# Patient Record
Sex: Female | Born: 1935 | Race: White | Hispanic: No | Smoking: Never smoker
Health system: Southern US, Community
[De-identification: ages and names within clinical notes are randomized; demographics above are authoritative.]

---

## 2013-06-22 DIAGNOSIS — J3489 Other specified disorders of nose and nasal sinuses: Secondary | ICD-10-CM | POA: Diagnosis not present

## 2013-06-22 DIAGNOSIS — H9190 Unspecified hearing loss, unspecified ear: Secondary | ICD-10-CM | POA: Diagnosis not present

## 2013-06-22 DIAGNOSIS — E785 Hyperlipidemia, unspecified: Secondary | ICD-10-CM | POA: Diagnosis not present

## 2013-06-22 DIAGNOSIS — I10 Essential (primary) hypertension: Secondary | ICD-10-CM | POA: Diagnosis not present

## 2013-06-22 DIAGNOSIS — R1310 Dysphagia, unspecified: Secondary | ICD-10-CM | POA: Diagnosis not present

## 2013-06-22 DIAGNOSIS — H6120 Impacted cerumen, unspecified ear: Secondary | ICD-10-CM | POA: Diagnosis not present

## 2013-06-22 DIAGNOSIS — J309 Allergic rhinitis, unspecified: Secondary | ICD-10-CM | POA: Diagnosis not present

## 2013-07-24 DIAGNOSIS — H2510 Age-related nuclear cataract, unspecified eye: Secondary | ICD-10-CM | POA: Diagnosis not present

## 2013-07-24 DIAGNOSIS — Z0100 Encounter for examination of eyes and vision without abnormal findings: Secondary | ICD-10-CM | POA: Diagnosis not present

## 2013-08-17 DIAGNOSIS — R51 Headache: Secondary | ICD-10-CM | POA: Diagnosis not present

## 2013-08-21 DIAGNOSIS — J329 Chronic sinusitis, unspecified: Secondary | ICD-10-CM | POA: Diagnosis not present

## 2013-09-11 DIAGNOSIS — I10 Essential (primary) hypertension: Secondary | ICD-10-CM | POA: Diagnosis not present

## 2013-09-11 DIAGNOSIS — M8448XA Pathological fracture, other site, initial encounter for fracture: Secondary | ICD-10-CM | POA: Diagnosis not present

## 2013-09-11 DIAGNOSIS — F411 Generalized anxiety disorder: Secondary | ICD-10-CM | POA: Diagnosis not present

## 2013-09-11 DIAGNOSIS — M810 Age-related osteoporosis without current pathological fracture: Secondary | ICD-10-CM | POA: Diagnosis not present

## 2013-09-11 DIAGNOSIS — B029 Zoster without complications: Secondary | ICD-10-CM | POA: Diagnosis not present

## 2013-09-11 DIAGNOSIS — J309 Allergic rhinitis, unspecified: Secondary | ICD-10-CM | POA: Diagnosis not present

## 2013-09-11 DIAGNOSIS — B070 Plantar wart: Secondary | ICD-10-CM | POA: Diagnosis not present

## 2013-09-11 DIAGNOSIS — E785 Hyperlipidemia, unspecified: Secondary | ICD-10-CM | POA: Diagnosis not present

## 2013-09-30 DIAGNOSIS — L821 Other seborrheic keratosis: Secondary | ICD-10-CM | POA: Diagnosis not present

## 2013-09-30 DIAGNOSIS — D485 Neoplasm of uncertain behavior of skin: Secondary | ICD-10-CM | POA: Diagnosis not present

## 2013-11-24 DIAGNOSIS — R350 Frequency of micturition: Secondary | ICD-10-CM | POA: Diagnosis not present

## 2013-11-24 DIAGNOSIS — M7989 Other specified soft tissue disorders: Secondary | ICD-10-CM | POA: Diagnosis not present

## 2013-11-24 DIAGNOSIS — I10 Essential (primary) hypertension: Secondary | ICD-10-CM | POA: Diagnosis not present

## 2013-11-24 DIAGNOSIS — R82998 Other abnormal findings in urine: Secondary | ICD-10-CM | POA: Diagnosis not present

## 2013-12-04 DIAGNOSIS — R5383 Other fatigue: Secondary | ICD-10-CM | POA: Diagnosis not present

## 2013-12-04 DIAGNOSIS — I10 Essential (primary) hypertension: Secondary | ICD-10-CM | POA: Diagnosis not present

## 2013-12-04 DIAGNOSIS — R5381 Other malaise: Secondary | ICD-10-CM | POA: Diagnosis not present

## 2013-12-04 DIAGNOSIS — N390 Urinary tract infection, site not specified: Secondary | ICD-10-CM | POA: Diagnosis not present

## 2013-12-26 DIAGNOSIS — R300 Dysuria: Secondary | ICD-10-CM | POA: Diagnosis not present

## 2013-12-26 DIAGNOSIS — N760 Acute vaginitis: Secondary | ICD-10-CM | POA: Diagnosis not present

## 2014-01-22 DIAGNOSIS — M25561 Pain in right knee: Secondary | ICD-10-CM | POA: Diagnosis not present

## 2014-01-22 DIAGNOSIS — M25562 Pain in left knee: Secondary | ICD-10-CM | POA: Diagnosis not present

## 2014-01-22 DIAGNOSIS — M170 Bilateral primary osteoarthritis of knee: Secondary | ICD-10-CM | POA: Diagnosis not present

## 2014-02-05 ENCOUNTER — Ambulatory Visit (INDEPENDENT_AMBULATORY_CARE_PROVIDER_SITE_OTHER): Payer: Medicare Other | Admitting: Physician Assistant

## 2014-02-05 VITALS — BP 173/86 | HR 88 | Ht 59.0 in | Wt 146.0 lb

## 2014-02-05 DIAGNOSIS — M170 Bilateral primary osteoarthritis of knee: Secondary | ICD-10-CM | POA: Diagnosis not present

## 2014-02-05 DIAGNOSIS — Z23 Encounter for immunization: Secondary | ICD-10-CM | POA: Diagnosis not present

## 2014-02-05 DIAGNOSIS — I10 Essential (primary) hypertension: Secondary | ICD-10-CM

## 2014-02-05 DIAGNOSIS — H1045 Other chronic allergic conjunctivitis: Secondary | ICD-10-CM

## 2014-02-05 DIAGNOSIS — J301 Allergic rhinitis due to pollen: Secondary | ICD-10-CM

## 2014-02-05 DIAGNOSIS — R300 Dysuria: Secondary | ICD-10-CM | POA: Diagnosis not present

## 2014-02-05 DIAGNOSIS — J309 Allergic rhinitis, unspecified: Secondary | ICD-10-CM | POA: Insufficient documentation

## 2014-02-05 DIAGNOSIS — N3000 Acute cystitis without hematuria: Secondary | ICD-10-CM | POA: Diagnosis not present

## 2014-02-05 LAB — POCT URINALYSIS DIPSTICK
BILIRUBIN UA: NEGATIVE
Blood, UA: NEGATIVE
Glucose, UA: NEGATIVE
KETONES UA: NEGATIVE
Nitrite, UA: NEGATIVE
PH UA: 6
Protein, UA: NEGATIVE
Spec Grav, UA: 1.015
Urobilinogen, UA: 0.2

## 2014-02-05 LAB — WET PREP FOR TRICH, YEAST, CLUE
CLUE CELLS WET PREP: NONE SEEN
TRICH WET PREP: NONE SEEN
WBC WET PREP: NONE SEEN
Yeast Wet Prep HPF POC: NONE SEEN

## 2014-02-05 MED ORDER — AZELASTINE HCL 0.1 % NA SOLN: 2.0000 | Freq: Every day | NASAL | Status: DC

## 2014-02-05 MED ORDER — BENAZEPRIL HCL 40 MG PO TABS: 40.0000 mg | ORAL_TABLET | Freq: Every day | ORAL | Status: DC

## 2014-02-05 MED ORDER — OLOPATADINE HCL 0.2 % OP SOLN: OPHTHALMIC | Status: DC

## 2014-02-05 MED ORDER — NITROFURANTOIN MONOHYD MACRO 100 MG PO CAPS: 100.0000 mg | ORAL_CAPSULE | Freq: Two times a day (BID) | ORAL | Status: DC

## 2014-02-05 NOTE — Progress Notes (Signed)
Subjective:    Patient ID: Mallory Reeves, female    DOB: [birth year], 80 y.o.   MRN: [id number]  HPI Pt is a 80 yo female who presents to the clinic to establish care. Her Doctor stopped practicing and needs a health care provider.   .. Active Ambulatory Problems    Diagnosis Date Noted  . Essential hypertension, benign 02/05/2014  . Primary osteoarthritis of both knees 02/05/2014  . Allergic rhinitis 02/05/2014  . Chronic allergic conjunctivitis 02/05/2014   Resolved Ambulatory Problems    Diagnosis Date Noted  . No Resolved Ambulatory Problems   No Additional Past Medical History  .Marland Kitchen History   Social History  . Marital Status: Unknown    Spouse Name: N/A    Number of Children: N/A  . Years of Education: N/A   Occupational History  . Not on file.   Social History Main Topics  . Smoking status: Not on file  . Smokeless tobacco: Not on file  . Alcohol Use: Not on file  . Drug Use: Not on file  . Sexual Activity: Not on file   Other Topics Concern  . Not on file   Social History Narrative  . No narrative on file   Hypertension-patient does have diagnosed hypertension well controlled with medication. Blood pressure is elevated today. Patient denies any chest pain, palpitations, headaches or vision changes. Her caregiver is with her and checks her blood pressure during the week. She states that a lot of times when she goes to a new facility her blood pressure is elevated.  Osteoarthritis of both knees-patient is currently being managed by [facility]ist [facility] [place]. She recently received steroid injection in the knee.     Review of Systems     Objective:   Physical Exam        Assessment & Plan:  Hypertension-discuss with patient likely could be elevated due to first visit. Encouraged caregiver to keep a log of blood pressures and if running above 160/90 to give [place] a call or follow-up in office. Will not make any adjustments to medications today refills  given.  Osteoarthritis bilateral knees-managed by orth[facility]ist of [place].  Patient unsure of her pneumonia vaccines. She states she has not had one in the last 3-5 years. Will give Prevnar 13 today. Likely in records we will confirm she has had the pneumococcal 23 vaccine.  Dysuria- .. Results for orders placed in visit on 02/05/14  WET PREP FOR [place], YEAST, CLUE      Result Value Ref Range   Yeast Wet Prep HPF POC NONE SEEN  NONE SEEN   Trich, Wet Prep NONE SEEN  NONE SEEN   Clue Cells Wet Prep HPF POC NONE SEEN  NONE SEEN   WBC, Wet Prep HPF POC NONE SEEN  NONE SEEN  POCT URINALYSIS DIPSTICK      Result Value Ref Range   Color, UA yellow     Clarity, UA clear     Glucose, UA neg     Bilirubin, UA neg     Ketones, UA neg     Spec Grav, UA 1.015     Blood, UA neg     pH, UA 6.0     Protein, UA neg     Urobilinogen, UA 0.2     Nitrite, UA neg     Leukocytes, UA small (1+)     Will treat for infection due to hx and age. macrobid sent. If symptoms continued please follow up to  discussed other causing of burning urination. Pt does not wear diaper and denies urinary leakage however I find this very hard to believe.   Patient's fall risk is high however she walks with a walker and had no fall risk in last year.   Patient does have living well.  Clinic no records to confirm Tdap vaccination.  Patient declines any further colonoscopies or mammograms.

## 2014-02-12 ENCOUNTER — Encounter: Payer: Self-pay | Admitting: Physician Assistant

## 2014-02-12 DIAGNOSIS — R531 Weakness: Secondary | ICD-10-CM | POA: Insufficient documentation

## 2014-03-05 ENCOUNTER — Encounter: Payer: Self-pay | Admitting: Physician Assistant

## 2014-03-05 ENCOUNTER — Ambulatory Visit (INDEPENDENT_AMBULATORY_CARE_PROVIDER_SITE_OTHER): Payer: Medicare Other | Admitting: Physician Assistant

## 2014-03-05 VITALS — BP 158/76 | HR 81 | Ht 59.0 in | Wt 148.0 lb

## 2014-03-05 DIAGNOSIS — R1310 Dysphagia, unspecified: Secondary | ICD-10-CM

## 2014-03-05 DIAGNOSIS — G4700 Insomnia, unspecified: Secondary | ICD-10-CM | POA: Diagnosis not present

## 2014-03-05 DIAGNOSIS — R531 Weakness: Secondary | ICD-10-CM | POA: Diagnosis not present

## 2014-03-05 DIAGNOSIS — R600 Localized edema: Secondary | ICD-10-CM

## 2014-03-05 DIAGNOSIS — I10 Essential (primary) hypertension: Secondary | ICD-10-CM | POA: Diagnosis not present

## 2014-03-05 DIAGNOSIS — H1045 Other chronic allergic conjunctivitis: Secondary | ICD-10-CM

## 2014-03-05 NOTE — Progress Notes (Signed)
Subjective:    Patient ID: Mallory Reeves, female    DOB: August 09, 1916, 80 y.o.   MRN: [id number]  HPI Patient is a 80 year old female who presents to the clinic to follow-up after establishing care 1 month ago.  Chronic allergic rhinitis/conjunctivitis-patient questions using nasal sprays daily and if she should continue. Symptoms seem to be under control.  Hypertension-continues to take Lotensin daily. Denies any chest pain, palpitations, headaches or vision changes.  Insomnia-we do not have all mirabilis but she states she has some nerve pills at home that she takes as needed when she cannot sleep. She tends to replate in her mom the death of her son and her 2 husbands. She does not take this every night. She wonders if this is okay.  Patient does have some bilateral lower extremity swelling. This is not daily but often on. She questions if there is anything she should do. She denies any leg pain. She has not done anything to make better. She does admit to sitting all day in her chair. She does not elevate her extremities.  Patient also has some off and on problems swallowing. She notices that breads get stuck the most. She wonders if there is anything we can do for this. She continues to eat and maintain a healthy weight.   Review of Systems  All other systems reviewed and are negative.      Objective:   Physical Exam  Constitutional: She is oriented to person, place, and time. She appears well-developed and well-nourished.  HENT:  Head: Normocephalic and atraumatic.  Right Ear: External ear normal.  Left Ear: External ear normal.  Nose: Nose normal.  Mouth/Throat: Oropharynx is clear and moist. No oropharyngeal exudate.  Eyes: Conjunctivae are normal.  Neck: Normal range of motion. Neck supple.  Cardiovascular: Normal rate, regular rhythm and normal heart sounds.   Pulmonary/Chest: Effort normal and breath sounds normal. She has no wheezes.  Lymphadenopathy:    She has no  cervical adenopathy.  Neurological: She is alert and oriented to person, place, and time.  Skin: Skin is dry.  Trace bilateral edema around ankles. Pedal pulses 2+ and symmetric. No color changes to bilateral feet.  Psychiatric: She has a normal mood and affect. Her behavior is normal.          Assessment & Plan:  Chronic allergic conjunctivitis and rhinitis-discuss with patient that I think it would not be unreasonable for her to start using nasal and eye drops every other day his symptoms are controlled. She's having some ongoing nasal dryness she could also consider ayr, nasal saline for moisture. Make sure humidifier is in room to help with symptoms.  Hypertension-patient blood pressure has come down from last visit as well as caregiver comes in with verbal communication of her blood pressures running much lower when she checks them at home in the 130 and 140s. Continue Lotensin. Follow-up in 6 months  Insomnia-discuss with patient I did not have nerve pill on her med list. Please call with name and dosage of tablet so that we can add. She did not need refills today. I did discuss that sounds like this pill is a benzodiazepine. We do use caution in the elderly with benzos. Discuss fall risk. Discussed the patient to use only as needed and only take before bedtime.  Bilateral ankle edema-discuss with patient the sounds like chronic venous insufficiency. Her pedal pulses are great. She denies any pain. We will treat with elevation and possible compression stockings. Patient  is just not able to put on compression stockings but I suggested with her caregiver a titer knee sock could also work. Currently patient is not elevating her legs. Start with elevation when she is in the chair. Discussed mechanism of action of the swelling. If gets worse or there is pain follow-up.  Dysphagia- reassured patient of what dysphagia is. Discussed we could do some testing but likely there is some slight narrowing  of the esophagus or perhaps even her epiglottis is having some dysfunction. She seems to be gaining weight and eating foods. Discuss proper chewing and to take time with meals. If she would like to proceed with testing she can call back and we can schedule. I did discuss with her that sometimes the procedures and the treatment for these things can set you back  more than treating the ongoing current condition. Discussed could try PPI to see if helped. pt did not want to start new medication. Follow-up as needed.  Please follow-up in 6 months or sooner if needed.

## 2014-03-05 NOTE — Patient Instructions (Addendum)
Ayr for nasal dryness.   Venous Stasis or Chronic Venous Insufficiency Chronic venous insufficiency, also called venous stasis, is a condition that affects the veins in the legs. The condition prevents blood from being pumped through these veins effectively. Blood may no longer be pumped effectively from the legs back to the heart. This condition can range from mild to severe. With proper treatment, you should be able to continue with an active life. CAUSES  Chronic venous insufficiency occurs when the vein walls become stretched, weakened, or damaged or when valves within the vein are damaged. Some common causes of this include:  High blood pressure inside the veins (venous hypertension).  Increased blood pressure in the leg veins from long periods of sitting or standing.  A blood clot that blocks blood flow in a vein (deep vein thrombosis).  Inflammation of a superficial vein (phlebitis) that causes a blood clot to form. RISK FACTORS Various things can make you more likely to develop chronic venous insufficiency, including:  Family history of this condition.  Obesity.  Pregnancy.  Sedentary lifestyle.  Smoking.  Jobs requiring long periods of standing or sitting in one place.  Being a certain age. Women in their 62s and 39s and men in their 32s are more likely to develop this condition. SIGNS AND SYMPTOMS  Symptoms may include:   Varicose veins.  Skin breakdown or ulcers.  Reddened or discolored skin on the leg.  Brown, smooth, tight, and painful skin just above the ankle, usually on the inside surface (lipodermatosclerosis).  Swelling. DIAGNOSIS  To diagnose this condition, your health care provider will take a medical history and do a physical exam. The following tests may be ordered to confirm the diagnosis:  Duplex ultrasound--A procedure that produces a picture of a blood vessel and nearby organs and also provides information on blood flow through the blood  vessel.  Plethysmography--A procedure that tests blood flow.  A venogram, or venography--A procedure used to look at the veins using X-ray and dye. TREATMENT The goals of treatment are to help you return to an active life and to minimize pain or disability. Treatment will depend on the severity of the condition. Medical procedures may be needed for severe cases. Treatment options may include:   Use of compression stockings. These can help with symptoms and lower the chances of the problem getting worse, but they do not cure the problem.  Sclerotherapy--A procedure involving an injection of a material that "dissolves" the damaged veins. Other veins in the network of blood vessels take over the function of the damaged veins.  Surgery to remove the vein or cut off blood flow through the vein (vein stripping or laser ablation surgery).  Surgery to repair a valve. HOME CARE INSTRUCTIONS   Wear compression stockings as directed by your health care provider.  Only take over-the-counter or prescription medicines for pain, discomfort, or fever as directed by your health care provider.  Follow up with your health care provider as directed. SEEK MEDICAL CARE IF:   You have redness, swelling, or increasing pain in the affected area.  You see a red streak or line that extends up or down from the affected area.  You have a breakdown or loss of skin in the affected area, even if the breakdown is small.  You have an injury to the affected area. SEEK IMMEDIATE MEDICAL CARE IF:   You have an injury and open wound in the affected area.  Your pain is severe and does not  improve with medicine.  You have sudden numbness or weakness in the foot or ankle below the affected area, or you have trouble moving your foot or ankle.  You have a fever or persistent symptoms for more than 2-3 days.  You have a fever and your symptoms suddenly get worse. MAKE SURE YOU:   Understand these  instructions.  Will watch your condition.  Will get help right away if you are not doing well or get worse. Document Released: 08/06/2006 Document Revised: 01/21/2013 Document Reviewed: 12/08/2012 [facility] Patient Information 2015 [place], [place]. This information is not intended to replace advice given to you by your health care provider. Make sure you discuss any questions you have with your health care provider.   Dysphagia Swallowing problems (dysphagia) occur when solids and liquids seem to stick in your throat on the way down to your stomach, or the food takes longer to get to the stomach. Other symptoms include regurgitating food, noises coming from the throat, chest discomfort with swallowing, and a feeling of fullness or the feeling of something being stuck in your throat when swallowing. When blockage in your throat is complete, it may be associated with drooling. CAUSES  Problems with swallowing may occur because of problems with the muscles. The food cannot be propelled in the usual manner into your stomach. You may have ulcers, scar tissue, or inflammation in the tube down which food travels from your mouth to your stomach (esophagus), which blocks food from passing normally into the stomach. Causes of inflammation include:  Acid reflux from your stomach into your esophagus.  Infection.  Radiation treatment for cancer.  Medicines taken without enough fluids to wash them down into your stomach. You may have nerve problems that prevent signals from being sent to the muscles of your esophagus to contract and move your food down to your stomach. Globus pharyngeus is a relatively common problem in which there is a sense of an obstruction or difficulty in swallowing, without any physical abnormalities of the swallowing passages being found. This problem usually improves over time with reassurance and testing to rule out other causes. DIAGNOSIS Dysphagia can be diagnosed and its cause  can be determined by tests in which you swallow a white substance that helps illuminate the inside of your throat (contrast medium) while X-rays are taken. Sometimes a flexible telescope that is inserted down your throat (endoscopy) to look at your esophagus and stomach is used. TREATMENT   If the dysphagia is caused by acid reflux or infection, medicines may be used.  If the dysphagia is caused by problems with your swallowing muscles, swallowing therapy may be used to help you strengthen your swallowing muscles.  If the dysphagia is caused by a blockage or mass, procedures to remove the blockage may be done. HOME CARE INSTRUCTIONS  Try to eat soft food that is easier to swallow and check your weight on a daily basis to be sure that it is not decreasing.  Be sure to drink liquids when sitting upright (not lying down). SEEK MEDICAL CARE IF:  You are losing weight because you are unable to swallow.  You are coughing when you drink liquids (aspiration).  You are coughing up partially digested food. SEEK IMMEDIATE MEDICAL CARE IF:  You are unable to swallow your own saliva .  You are having shortness of breath or a fever, or both.  You have a hoarse voice along with difficulty swallowing. MAKE SURE YOU:  Understand these instructions.  Will watch  your condition.  Will get help right away if you are not doing well or get worse. Document Released: 03/30/2000 Document Revised: 08/17/2013 Document Reviewed: 09/19/2012 [facility] Patient Information 2015 [place], [place]. This information is not intended to replace advice given to you by your health care provider. Make sure you discuss any questions you have with your health care provider.

## 2014-06-07 DIAGNOSIS — M79671 Pain in right foot: Secondary | ICD-10-CM | POA: Diagnosis not present

## 2014-06-07 DIAGNOSIS — M79672 Pain in left foot: Secondary | ICD-10-CM | POA: Diagnosis not present

## 2014-06-27 ENCOUNTER — Other Ambulatory Visit: Payer: Self-pay | Admitting: Physician Assistant

## 2014-07-30 DIAGNOSIS — M25562 Pain in left knee: Secondary | ICD-10-CM | POA: Diagnosis not present

## 2014-07-30 DIAGNOSIS — M170 Bilateral primary osteoarthritis of knee: Secondary | ICD-10-CM | POA: Diagnosis not present

## 2014-07-30 DIAGNOSIS — M25561 Pain in right knee: Secondary | ICD-10-CM | POA: Diagnosis not present

## 2014-09-03 ENCOUNTER — Ambulatory Visit: Payer: Medicare Other | Admitting: Family Medicine

## 2014-09-03 DIAGNOSIS — H2513 Age-related nuclear cataract, bilateral: Secondary | ICD-10-CM | POA: Diagnosis not present

## 2014-09-03 DIAGNOSIS — H3531 Nonexudative age-related macular degeneration: Secondary | ICD-10-CM | POA: Diagnosis not present

## 2014-09-10 ENCOUNTER — Ambulatory Visit (INDEPENDENT_AMBULATORY_CARE_PROVIDER_SITE_OTHER): Payer: Medicare Other | Admitting: Family Medicine

## 2014-09-10 ENCOUNTER — Encounter: Payer: Self-pay | Admitting: Family Medicine

## 2014-09-10 VITALS — BP 179/77 | HR 74 | Wt 149.0 lb

## 2014-09-10 DIAGNOSIS — R350 Frequency of micturition: Secondary | ICD-10-CM | POA: Diagnosis not present

## 2014-09-10 DIAGNOSIS — G4700 Insomnia, unspecified: Secondary | ICD-10-CM

## 2014-09-10 DIAGNOSIS — N898 Other specified noninflammatory disorders of vagina: Secondary | ICD-10-CM | POA: Diagnosis not present

## 2014-09-10 DIAGNOSIS — L989 Disorder of the skin and subcutaneous tissue, unspecified: Secondary | ICD-10-CM

## 2014-09-10 DIAGNOSIS — I10 Essential (primary) hypertension: Secondary | ICD-10-CM | POA: Diagnosis not present

## 2014-09-10 DIAGNOSIS — M1712 Unilateral primary osteoarthritis, left knee: Secondary | ICD-10-CM

## 2014-09-10 DIAGNOSIS — J309 Allergic rhinitis, unspecified: Secondary | ICD-10-CM

## 2014-09-10 LAB — POCT URINALYSIS DIPSTICK
Bilirubin, UA: NEGATIVE
GLUCOSE UA: NEGATIVE
Ketones, UA: NEGATIVE
NITRITE UA: NEGATIVE
PH UA: 5.5
Protein, UA: NEGATIVE
SPEC GRAV UA: 1.02
Urobilinogen, UA: 0.2

## 2014-09-10 MED ORDER — DICLOFENAC SODIUM 1 % TD GEL: 2.0000 g | Freq: Four times a day (QID) | TRANSDERMAL | Status: DC

## 2014-09-10 MED ORDER — CLOTRIMAZOLE 1 % EX CREA: TOPICAL_CREAM | CUTANEOUS | Status: AC

## 2014-09-10 MED ORDER — AZELASTINE HCL 0.1 % NA SOLN: 2.0000 | Freq: Every day | NASAL | Status: DC

## 2014-09-10 MED ORDER — LEVOCETIRIZINE DIHYDROCHLORIDE 5 MG PO TABS: 5.0000 mg | ORAL_TABLET | Freq: Every evening | ORAL | Status: DC

## 2014-09-10 MED ORDER — OLOPATADINE HCL 0.2 % OP SOLN
OPHTHALMIC | Status: DC
Start: 2014-09-10 — End: 2015-02-09

## 2014-09-10 MED ORDER — CLONAZEPAM 0.125 MG PO TBDP: 0.1250 mg | ORAL_TABLET | Freq: Every evening | ORAL | Status: DC | PRN

## 2014-09-10 MED ORDER — BENAZEPRIL HCL 40 MG PO TABS: 40.0000 mg | ORAL_TABLET | Freq: Every day | ORAL | Status: DC

## 2014-09-10 NOTE — Progress Notes (Signed)
CC: Mallory Reeves is a 81 y.o. female is here for Hypertension   Subjective: HPI:  Follow-up essential hypertension: Requesting refills on benazepril. She brings in blood pressures from home with 90% of blood pressures being in the normotensive range and one single reading in the stage I hypertension range. She denies known side effects from benazepril but recently read a newspaper that benazepril was causing urinary frequency and she believes that she may be suffering from the side effect. She feels like she has urinary urgency and frequency mild in severity most days of the week she is uncertain when this began but was recently and not around the time that she started benazepril.  Complains of vaginal irritation that occurs if she is wearing depends. It seems to get worse if she is unable to change her diaper within a few minutes of leakage. It's described as irritation and itching. No interventions as of yet. He denies vaginal discharge or dysuria  Requesting refills on Astelin. Provided she uses on a daily basis she has resolution and prevention of her nasal congestion and postnasal drip. She is requesting refills  Complains of a spot on her left cheek and left ear that's been present for matter of months. She will scratch them off every few weeks and within a few days they grow back. The one on the cheek occasionally bleeds when she scratches it. They feel like they're slightly irritated. No interventions other than that above. There's been no unintentional weight loss or lymph node swelling  Requesting refills on Voltaren which she uses for her knee osteoarthritis. She is not a candidate for sterile injections because of some forgotten allergic reaction to cortisone. She denies any mechanical symptoms with the knees  Requesting refills on clonazepam. She takes this a few nights a week right at bedtime if she starts thinking about her 2 dead husbands. This brings on immense anxiety that that  interferes with falling asleep and staying asleep and she takes clonazepam. No known side effects or falls.  Complains of left ear fullness that has been present for the last 2 or 3 days. Nothing seems to make it better or worse. No interventions as of yet. Denies hearing loss from baseline.   Review Of Systems Outlined In HPI  No past medical history on file.  No past surgical history on file. No family history on file.  History   Social History  . Marital Status: Unknown    Spouse Name: N/A  . Number of Children: N/A  . Years of Education: N/A   Occupational History  . Not on file.   Social History Main Topics  . Smoking status: Never Smoker   . Smokeless tobacco: Not on file  . Alcohol Use: Not on file  . Drug Use: Not on file  . Sexual Activity: Not on file   Other Topics Concern  . Not on file   Social History Narrative     Objective: BP 179/77 mmHg  Pulse 74  Wt 149 lb (67.586 kg)  General: Alert and Oriented, No Acute Distress HEENT: Pupils equal, round, reactive to light. Conjunctivae clear.  External ears unremarkable, canals clear with intact TMs with appropriate landmarks.  Left Middle ear appears open without effusion, right middle ear appears to have a mild serous effusion. Pink inferior turbinates.  Moist mucous membranes, pharynx without inflammation nor lesions.  Neck supple without palpable lymphadenopathy nor abnormal masses. Lungs: Clear to auscultation bilaterally, no wheezing/ronchi/rales.  Comfortable work of breathing. Good  air movement. Cardiac: Regular rate and rhythm. Normal S1/S2.  No murmurs, rubs, nor gallops.   Extremities: No peripheral edema.  Strong peripheral pulses.  Mental Status: No depression, anxiety, nor agitation. Skin: Warm and dry. Actinic keratosis on the upper rim of the left ear, flaking and what appears to also be actinic keratosis on the left cheek  Assessment & Plan: Mallory Reeves was seen today for hypertension.  Diagnoses  and all orders for this visit:  Urinary frequency Orders: -     Urine culture -     Urinalysis Dipstick  Essential hypertension, benign Orders: -     benazepril (LOTENSIN) 40 MG tablet; Take 1 tablet (40 mg total) by mouth daily.  Vaginal irritation Orders: -     clotrimazole (LOTRIMIN) 1 % cream; Apply to irritated area twice a day for two weeks.  Allergic rhinitis, unspecified allergic rhinitis type Orders: -     azelastine (ASTELIN) 0.1 % nasal spray; Place 2 sprays into both nostrils daily. Use in each nostril as directed  Facial lesion Orders: -     Ambulatory referral to Dermatology  Primary osteoarthritis of left knee  Insomnia  Other orders -     levocetirizine (XYZAL) 5 MG tablet; Take 1 tablet (5 mg total) by mouth every evening. -     Olopatadine HCl (PATADAY) 0.2 % SOLN; 1 drop into affected eye daily. -     diclofenac sodium (VOLTAREN) 1 % GEL; Apply 2 g topically 4 (four) times daily. -     clonazepam (KLONOPIN) 0.125 MG disintegrating tablet; Take 1 tablet (0.125 mg total) by mouth at bedtime as needed for seizure.   Urinary frequency: Obtaining urinalysis, results are inconclusive therefore following culture to determine if she needs to start on a nearby. Essential hypertension: Controlled at home will consider this whitecoat hypertension and no adjustment to be made to her benazepril. Discussed with her that the article she was reading probably was referring to benazepril with a diarrhetic mixed in with it and not her current formulation Vaginal irritation: Start clotrimazole for suspected candidiasis Allergic rhinitis: Overall controlled with Astelin encouraged to also use Afrin for 3 days to see if this helps open up eustachian tube and get rid of the fearfulness that she was complaining of today Skin lesions: Referral to dermatology since we did not have time today to apply cryotherapy Insomnia: Controlled with clonazepam Osteoarthritis: Controlled with  Voltaren.  40 minutes spent face-to-face during visit today of which at least 50% was counseling or coordinating care regarding: 1. Urinary frequency   2. Essential hypertension, benign   3. Vaginal irritation   4. Allergic rhinitis, unspecified allergic rhinitis type   5. Facial lesion   6. Primary osteoarthritis of left knee   7. Insomnia       Return in about 3 months (around 12/11/2014) for BP Follow Up.

## 2014-09-10 NOTE — Patient Instructions (Signed)
For ear fullness and dizziness start taking over the counter Afrin nasal spray, using this twice a day for three days.

## 2014-09-12 LAB — URINE CULTURE
COLONY COUNT: NO GROWTH
Organism ID, Bacteria: NO GROWTH

## 2014-10-04 DIAGNOSIS — L821 Other seborrheic keratosis: Secondary | ICD-10-CM | POA: Diagnosis not present

## 2014-10-04 DIAGNOSIS — D2239 Melanocytic nevi of other parts of face: Secondary | ICD-10-CM | POA: Diagnosis not present

## 2014-10-04 DIAGNOSIS — C44329 Squamous cell carcinoma of skin of other parts of face: Secondary | ICD-10-CM | POA: Diagnosis not present

## 2014-10-04 DIAGNOSIS — D485 Neoplasm of uncertain behavior of skin: Secondary | ICD-10-CM | POA: Diagnosis not present

## 2014-10-04 DIAGNOSIS — L570 Actinic keratosis: Secondary | ICD-10-CM | POA: Diagnosis not present

## 2014-11-12 DIAGNOSIS — J0100 Acute maxillary sinusitis, unspecified: Secondary | ICD-10-CM | POA: Diagnosis not present

## 2014-11-12 DIAGNOSIS — R350 Frequency of micturition: Secondary | ICD-10-CM | POA: Diagnosis not present

## 2014-11-12 DIAGNOSIS — H6981 Other specified disorders of Eustachian tube, right ear: Secondary | ICD-10-CM | POA: Diagnosis not present

## 2014-11-15 ENCOUNTER — Encounter: Payer: Self-pay | Admitting: Family Medicine

## 2014-11-15 ENCOUNTER — Ambulatory Visit (INDEPENDENT_AMBULATORY_CARE_PROVIDER_SITE_OTHER): Payer: Medicare Other | Admitting: Family Medicine

## 2014-11-15 VITALS — BP 148/77 | HR 84 | Wt 151.0 lb

## 2014-11-15 DIAGNOSIS — R1310 Dysphagia, unspecified: Secondary | ICD-10-CM | POA: Diagnosis not present

## 2014-11-15 DIAGNOSIS — I10 Essential (primary) hypertension: Secondary | ICD-10-CM

## 2014-11-15 NOTE — Progress Notes (Signed)
CC: Mallory Reeves is a 81 y.o. female is here for elevated BP last week   Subjective: HPI:  Follow essential hypertension: She tells me that blood pressures at home have been normotensive except for on Friday when the blood pressure was 701 systolic. Her home health aide confirms that blood pressures are consistently in the normotensive range at home. No chest pain shortness of breath or orthopnea.  Her major complaint today is dysphagia that has been present for matter of years. It seems to have not gotten better or worse since the onset. She reports that food will get stuck in the back of her throat and feels like it takes more effort than what she is used to to swallow it down her esophagus. She denies regurgitation, choking, nor vomiting. Symptoms are mild in severity. She's been told by 2 other providers that any procedure to remedy this would not be an option to her since she is not a surgical candidate. Denies unintentional weight loss, abdominal pain or oral pain.   Review Of Systems Outlined In HPI  No past medical history on file.  No past surgical history on file. No family history on file.  History   Social History  . Marital Status: Unknown    Spouse Name: N/A  . Number of Children: N/A  . Years of Education: N/A   Occupational History  . Not on file.   Social History Main Topics  . Smoking status: Never Smoker   . Smokeless tobacco: Not on file  . Alcohol Use: Not on file  . Drug Use: Not on file  . Sexual Activity: Not on file   Other Topics Concern  . Not on file   Social History Narrative     Objective: BP 148/77 mmHg  Pulse 84  Wt 151 lb (68.493 kg)  General: Alert and Oriented, No Acute Distress HEENT: Pupils equal, round, reactive to light. Conjunctivae clear.  External ears unremarkable, canals clear with intact TMs with appropriate landmarks.  Middle ear appears open without effusion. Pink inferior turbinates.  Moist mucous membranes, pharynx without  inflammation nor lesions.  Neck supple without palpable lymphadenopathy nor abnormal masses. Lungs: Clear to auscultation bilaterally, no wheezing/ronchi/rales.  Comfortable work of breathing. Good air movement. Cardiac: Regular rate and rhythm. Normal S1/S2.  No murmurs, rubs, nor gallops.   Extremities: No peripheral edema.  Strong peripheral pulses.  Mental Status: No depression, anxiety, nor agitation. Skin: Warm and dry.  Assessment & Plan: Mallory Reeves was seen today for elevated bp last week.  Diagnoses and all orders for this visit:  Dysphagia Orders: -     DG Esophagus  Essential hypertension, benign   Dysphagia: Requesting barium swallow study to look into stricture or mass causing her symptoms. Essential hypertension: Controlled continue benazepril  Return if symptoms worsen or fail to improve.

## 2014-11-20 DIAGNOSIS — K625 Hemorrhage of anus and rectum: Secondary | ICD-10-CM | POA: Diagnosis not present

## 2014-11-20 DIAGNOSIS — R195 Other fecal abnormalities: Secondary | ICD-10-CM | POA: Diagnosis not present

## 2014-11-20 DIAGNOSIS — K5730 Diverticulosis of large intestine without perforation or abscess without bleeding: Secondary | ICD-10-CM | POA: Diagnosis not present

## 2014-11-20 DIAGNOSIS — Z791 Long term (current) use of non-steroidal anti-inflammatories (NSAID): Secondary | ICD-10-CM | POA: Diagnosis not present

## 2014-11-20 DIAGNOSIS — N281 Cyst of kidney, acquired: Secondary | ICD-10-CM | POA: Diagnosis not present

## 2014-11-20 DIAGNOSIS — M810 Age-related osteoporosis without current pathological fracture: Secondary | ICD-10-CM | POA: Diagnosis not present

## 2014-11-20 DIAGNOSIS — K922 Gastrointestinal hemorrhage, unspecified: Secondary | ICD-10-CM | POA: Diagnosis not present

## 2014-11-20 DIAGNOSIS — Z880 Allergy status to penicillin: Secondary | ICD-10-CM | POA: Diagnosis not present

## 2014-11-20 DIAGNOSIS — I10 Essential (primary) hypertension: Secondary | ICD-10-CM | POA: Diagnosis not present

## 2014-11-20 DIAGNOSIS — Z87891 Personal history of nicotine dependence: Secondary | ICD-10-CM | POA: Diagnosis not present

## 2014-11-20 DIAGNOSIS — K219 Gastro-esophageal reflux disease without esophagitis: Secondary | ICD-10-CM | POA: Diagnosis not present

## 2014-11-20 DIAGNOSIS — Z888 Allergy status to other drugs, medicaments and biological substances status: Secondary | ICD-10-CM | POA: Diagnosis not present

## 2014-11-20 DIAGNOSIS — M1990 Unspecified osteoarthritis, unspecified site: Secondary | ICD-10-CM | POA: Diagnosis not present

## 2014-11-20 DIAGNOSIS — E785 Hyperlipidemia, unspecified: Secondary | ICD-10-CM | POA: Diagnosis not present

## 2014-11-20 DIAGNOSIS — Z881 Allergy status to other antibiotic agents status: Secondary | ICD-10-CM | POA: Diagnosis not present

## 2014-11-20 DIAGNOSIS — Z79899 Other long term (current) drug therapy: Secondary | ICD-10-CM | POA: Diagnosis not present

## 2014-11-20 DIAGNOSIS — R030 Elevated blood-pressure reading, without diagnosis of hypertension: Secondary | ICD-10-CM | POA: Diagnosis not present

## 2014-11-22 ENCOUNTER — Ambulatory Visit (HOSPITAL_COMMUNITY): Admission: RE | Admit: 2014-11-22 | Payer: Medicare Other | Source: Ambulatory Visit

## 2014-12-27 ENCOUNTER — Other Ambulatory Visit: Payer: Self-pay | Admitting: *Deleted

## 2014-12-27 MED ORDER — DICLOFENAC SODIUM 1 % TD GEL: 2.0000 g | Freq: Four times a day (QID) | TRANSDERMAL | Status: DC

## 2015-01-14 ENCOUNTER — Ambulatory Visit: Payer: Medicare Other | Admitting: Physician Assistant

## 2015-01-19 ENCOUNTER — Ambulatory Visit: Payer: Medicare Other | Admitting: Physician Assistant

## 2015-02-02 DIAGNOSIS — M25562 Pain in left knee: Secondary | ICD-10-CM | POA: Diagnosis not present

## 2015-02-02 DIAGNOSIS — M25561 Pain in right knee: Secondary | ICD-10-CM | POA: Diagnosis not present

## 2015-02-02 DIAGNOSIS — M170 Bilateral primary osteoarthritis of knee: Secondary | ICD-10-CM | POA: Diagnosis not present

## 2015-02-09 ENCOUNTER — Encounter: Payer: Self-pay | Admitting: Physician Assistant

## 2015-02-09 ENCOUNTER — Ambulatory Visit (INDEPENDENT_AMBULATORY_CARE_PROVIDER_SITE_OTHER): Payer: Medicare Other | Admitting: Physician Assistant

## 2015-02-09 VITALS — BP 142/62 | HR 74 | Wt 151.0 lb

## 2015-02-09 DIAGNOSIS — R1310 Dysphagia, unspecified: Secondary | ICD-10-CM

## 2015-02-09 DIAGNOSIS — Z13220 Encounter for screening for lipoid disorders: Secondary | ICD-10-CM

## 2015-02-09 DIAGNOSIS — H1045 Other chronic allergic conjunctivitis: Secondary | ICD-10-CM

## 2015-02-09 DIAGNOSIS — Z23 Encounter for immunization: Secondary | ICD-10-CM

## 2015-02-09 DIAGNOSIS — N3281 Overactive bladder: Secondary | ICD-10-CM

## 2015-02-09 DIAGNOSIS — I10 Essential (primary) hypertension: Secondary | ICD-10-CM

## 2015-02-09 DIAGNOSIS — Z79899 Other long term (current) drug therapy: Secondary | ICD-10-CM | POA: Diagnosis not present

## 2015-02-09 DIAGNOSIS — J309 Allergic rhinitis, unspecified: Secondary | ICD-10-CM

## 2015-02-09 LAB — COMPLETE METABOLIC PANEL WITH GFR
ALT: 13 U/L (ref 6–29)
AST: 23 U/L (ref 10–35)
Albumin: 3.8 g/dL (ref 3.6–5.1)
Alkaline Phosphatase: 80 U/L (ref 33–130)
BILIRUBIN TOTAL: 0.6 mg/dL (ref 0.2–1.2)
BUN: 18 mg/dL (ref 7–25)
CALCIUM: 9 mg/dL (ref 8.6–10.4)
CHLORIDE: 106 mmol/L (ref 98–110)
CO2: 21 mmol/L (ref 20–31)
CREATININE: 0.71 mg/dL (ref 0.60–0.88)
GFR, EST NON AFRICAN AMERICAN: 73 mL/min (ref >=60)
GFR, Est African American: 84 mL/min (ref >=60)
Glucose, Bld: 84 mg/dL (ref 65–99)
Potassium: 4.1 mmol/L (ref 3.5–5.3)
Sodium: 139 mmol/L (ref 135–146)
Total Protein: 6.8 g/dL (ref 6.1–8.1)

## 2015-02-09 LAB — LIPID PANEL
CHOL/HDL RATIO: 3.4 ratio (ref <=5.0)
Cholesterol: 198 mg/dL (ref 125–200)
HDL: 59 mg/dL (ref >=46)
LDL Cholesterol: 107 mg/dL (ref <130)
Triglycerides: 158 mg/dL — ABNORMAL HIGH (ref <150)
VLDL: 32 mg/dL — AB (ref <30)

## 2015-02-09 MED ORDER — SOLIFENACIN SUCCINATE 5 MG PO TABS: 5.0000 mg | ORAL_TABLET | Freq: Every day | ORAL | Status: DC

## 2015-02-09 MED ORDER — LEVOCETIRIZINE DIHYDROCHLORIDE 5 MG PO TABS: 5.0000 mg | ORAL_TABLET | Freq: Every evening | ORAL | Status: DC

## 2015-02-09 MED ORDER — AZELASTINE HCL 0.1 % NA SOLN: 2.0000 | Freq: Every day | NASAL | Status: DC

## 2015-02-09 MED ORDER — CLONAZEPAM 0.125 MG PO TBDP: ORAL_TABLET | ORAL | Status: DC

## 2015-02-09 MED ORDER — CLONAZEPAM 0.125 MG PO TBDP: 0.1250 mg | ORAL_TABLET | Freq: Every evening | ORAL | Status: DC | PRN

## 2015-02-09 MED ORDER — BENAZEPRIL HCL 40 MG PO TABS: 40.0000 mg | ORAL_TABLET | Freq: Every day | ORAL | Status: DC

## 2015-02-09 MED ORDER — OLOPATADINE HCL 0.2 % OP SOLN: OPHTHALMIC | Status: DC

## 2015-02-09 NOTE — Progress Notes (Signed)
   Subjective:    Patient ID: Mallory Reeves, female    DOB: Aug 25, 1916, 81 y.o.   MRN: [id number]  HPI Pt presents to the clinic with her caregiver. She is doing well today.   HTN- no problems and taking medication daily.   Chronic allergies- well controlled today and dysphagia has resolved since starting astelin.   Insomnia- klonapin as needed to go to sleep. Not taking daily.   Bilateral OA- improving after bilateral knee injections.   Pt does mention overactive bladder symptoms. She has frequent leakage and urge. She often does not make it to the bathroom. She would like treatment. Diapers tend to irritate her skin and does not like to wear.    Review of Systems  All other systems reviewed and are negative.      Objective:   Physical Exam  Constitutional: She is oriented to person, place, and time. She appears well-developed and well-nourished.  HENT:  Head: Normocephalic and atraumatic.  Cardiovascular: Normal rate, regular rhythm and normal heart sounds.   Pulmonary/Chest: Effort normal and breath sounds normal.  Abdominal: Soft. Bowel sounds are normal. She exhibits no distension and no mass. There is no tenderness. There is no rebound and no guarding.  Neurological: She is alert and oriented to person, place, and time.  Psychiatric: She has a normal mood and affect. Her behavior is normal.          Assessment & Plan:  HTN- improved with 2nd recheck to 142/62. Refilled benzapril.   OAB- desires medication. Sent vesicare. Side effects discussed. We could consider mrybetriq if pt is symptomatic with vesicare. Follow up in 2 months.   Dysphagia- pt reports resolved with astelin.   Chronic allergic rhinitis/seasonal allergies- xyzal, astelin, and pataday.  Insomnia- klonapin as needed for sleep. Does not take every night.    Primary OA both knees- patient sees [facility] and had bilateral knee injections last week. She uses all tear and voltaren gel as  needed for symptomatic relief.  Flu shot given today. Pneumonia 23 given today.   Lipid and cmp ordered today did not have up to date record of this.

## 2015-02-09 NOTE — Patient Instructions (Signed)
Solifenacin tablets What is this medicine? SOLIFENACIN (sol i FEN a cin) is used to treat overactive bladder. This medicine reduces the amount of bathroom visits. It may also help to control wetting accidents. This medicine may be used for other purposes; ask your health care provider or pharmacist if you have questions. What should I tell my health care provider before I take this medicine? They need to know if you have any of these conditions: -difficulty passing urine -glaucoma -intestinal obstruction -kidney disease -liver disease -an unusual or allergic reaction to solifenacin, other medicines, foods, dyes, or preservatives -pregnant or trying to get pregnant -breast-feeding How should I use this medicine? Take this medicine by mouth with a glass of water. Follow the directions on the prescription label. This medicine can be taken with or without food. Take your doses at regular intervals. Do not take your medicine more often than directed. Do not stop taking except on the advice of your doctor or health care professional. Talk to your pediatrician regarding the use of this medicine in children. Special care may be needed. Overdosage: If you think you have taken too much of this medicine contact a poison control center or emergency room at once. NOTE: This medicine is only for you. Do not share this medicine with others. What if I miss a dose? If you miss a dose, take it as soon as you can. If it is almost time for your next dose, take only that dose. Do not take double or extra doses. What may interact with this medicine? Do not take this medicine with any of the following medications: -certain medicines for fungal infections like fluconazole, itraconazole, ketoconazole, posaconazole, voriconazole -cisapride -dofetilide -dronedarone -pimozide -thioridazine -ziprasidone This medicine may also interact with the following medications: -atropine -other medicines that prolong the QT  interval (cause an abnormal heart rhythm) -oxybutynin -scopolamine This list may not describe all possible interactions. Give your health care provider a list of all the medicines, herbs, non-prescription drugs, or dietary supplements you use. Also tell them if you smoke, drink alcohol, or use illegal drugs. Some items may interact with your medicine. What should I watch for while using this medicine? It may take 2 or 3 months to notice the full benefit from this medicine. You may need to limit your intake tea, coffee, caffeinated sodas, and alcohol. These drinks may make your symptoms worse. You may get drowsy or dizzy. Do not drive, use machinery, or do anything that needs mental alertness until you know how this medicine affects you. Do not stand or sit up quickly, especially if you are an older patient. This reduces the risk of dizzy or fainting spells. Alcohol may interfere with the effect of this medicine. Avoid alcoholic drinks. Your mouth may get dry. Chewing sugarless gum or sucking hard candy, and drinking plenty of water may help. Contact your doctor if the problem does not go away or is severe. This medicine may cause dry eyes and blurred vision. If you wear contact lenses, you may feel some discomfort. Lubricating drops may help. See your eye care professional if the problem does not go away or is severe. Avoid extreme heat. This medicine can cause you to sweat less than normal. Your body temperature could increase to dangerous levels, which may lead to heat stroke. What side effects may I notice from receiving this medicine? Side effects that you should report to your doctor or health care professional as soon as possible: -allergic reactions like skin rash, itching  or hives, swelling of the face, lips, or tongue -blurred vision or difficulty focusing vision -breathing problems -chest pain or palpitations -confusion -fast, irregular heartbeat -feeling faint or lightheaded,  falls -hallucinations -severe dizziness -trouble passing urine or change in the amount of urine -unusually weak or tired Side effects that usually do not require medical attention (report to your doctor or health care professional if they continue or are bothersome): -constipation -drowsiness -headache -indigestion or stomach upset -nausea This list may not describe all possible side effects. Call your doctor for medical advice about side effects. You may report side effects to FDA at 1-800-FDA-1088. Where should I keep my medicine? Keep out of the reach of children. Store at room temperature between 15 and 30 degrees C (59 and 86 degrees F). Throw away any unused medicine after the expiration date. NOTE: This sheet is a summary. It may not cover all possible information. If you have questions about this medicine, talk to your doctor, pharmacist, or health care provider.    2016, Elsevier/Gold Standard. (2012-10-28 12:42:09)

## 2015-02-16 ENCOUNTER — Telehealth: Payer: Self-pay | Admitting: Physician Assistant

## 2015-02-16 NOTE — Telephone Encounter (Signed)
Received fax for prior authorization on Vesicare sent through cover my meds waiting on authorization. - CF

## 2015-02-25 NOTE — Telephone Encounter (Signed)
Medication is denied due to having to try formulary medications first. The formulary Medications are Oxybutin Chloride Tab 5 mg, Oxybutin CL ER Tab 5 mg, 10 mg, and 15 mg, Trospium Chloride Tab 20 mg, Tolterodine Tart ER Caps 2 mg or 4 mg. - CF

## 2015-03-02 ENCOUNTER — Other Ambulatory Visit: Payer: Self-pay | Admitting: Physician Assistant

## 2015-03-02 MED ORDER — OXYBUTYNIN CHLORIDE ER 5 MG PO TB24: 5.0000 mg | ORAL_TABLET | Freq: Every day | ORAL | Status: DC

## 2015-03-02 NOTE — Progress Notes (Signed)
Insurance would not pay for vesicare. Sent oxybutin instead.

## 2015-04-06 ENCOUNTER — Other Ambulatory Visit: Payer: Self-pay | Admitting: *Deleted

## 2015-04-06 ENCOUNTER — Telehealth: Payer: Self-pay | Admitting: Physician Assistant

## 2015-04-06 ENCOUNTER — Ambulatory Visit: Payer: Medicare Other | Admitting: Physician Assistant

## 2015-04-06 MED ORDER — DICLOFENAC SODIUM 1 % TD GEL: 2.0000 g | Freq: Four times a day (QID) | TRANSDERMAL | Status: DC

## 2015-04-06 NOTE — Telephone Encounter (Signed)
Please send refill for voltaren gel #5 refills

## 2015-04-06 NOTE — Telephone Encounter (Signed)
[facility]

## 2015-04-06 NOTE — Telephone Encounter (Signed)
Pt called and had to reschedule her appt from today to next wed and asked if there was a wayt o get the voltaren gel refilled because it has really helped. Thanks

## 2015-04-13 ENCOUNTER — Encounter: Payer: Self-pay | Admitting: Physician Assistant

## 2015-04-13 ENCOUNTER — Ambulatory Visit (INDEPENDENT_AMBULATORY_CARE_PROVIDER_SITE_OTHER): Payer: Medicare Other | Admitting: Physician Assistant

## 2015-04-13 VITALS — BP 131/54 | HR 78 | Ht 59.0 in | Wt 147.0 lb

## 2015-04-13 DIAGNOSIS — N3281 Overactive bladder: Secondary | ICD-10-CM

## 2015-04-13 DIAGNOSIS — I10 Essential (primary) hypertension: Secondary | ICD-10-CM

## 2015-04-13 DIAGNOSIS — K5900 Constipation, unspecified: Secondary | ICD-10-CM | POA: Diagnosis not present

## 2015-04-13 MED ORDER — MIRABEGRON ER 25 MG PO TB24
25.0000 mg | ORAL_TABLET | Freq: Every day | ORAL | Status: DC
Start: 2015-04-13 — End: 2015-06-22

## 2015-04-13 NOTE — Progress Notes (Signed)
   Subjective:    Patient ID: Mallory Reeves, female    DOB: [birth year], 81 y.o.   MRN: [id number]  HPI Patient is a 81 year old female who presents to the clinic with her caregiver. She comes in to follow-up on overactive bladder and initiation of ditropan. She's had great results with medication. She reports that her going to the bathroom has decreased by 50-75%. She is concerned because it did give her some constipation. She started taking the ditropan every other day to help with constipation.    Review of Systems  All other systems reviewed and are negative.      Objective:   Physical Exam  Constitutional: She is oriented to person, place, and time. She appears well-developed and well-nourished.  HENT:  Head: Normocephalic and atraumatic.  Cardiovascular: Normal rate, regular rhythm and normal heart sounds.   Pulmonary/Chest: Effort normal and breath sounds normal.  Neurological: She is alert and oriented to person, place, and time.  Psychiatric: She has a normal mood and affect. Her behavior is normal.          Assessment & Plan:  OAB- refilled ditropan. We could call and see if myrbetriq would be approved now since had constipation and decreasing the frequency of how you are taking medication.   Constipation- stool softener as needed. Drink plenty of water.   HTN- 2nd recheck looked good. 123/90 at home this morning by caregiver.

## 2015-05-04 DIAGNOSIS — Z87891 Personal history of nicotine dependence: Secondary | ICD-10-CM | POA: Diagnosis not present

## 2015-05-04 DIAGNOSIS — X58XXXA Exposure to other specified factors, initial encounter: Secondary | ICD-10-CM | POA: Diagnosis not present

## 2015-05-04 DIAGNOSIS — K5900 Constipation, unspecified: Secondary | ICD-10-CM | POA: Diagnosis not present

## 2015-05-04 DIAGNOSIS — Z880 Allergy status to penicillin: Secondary | ICD-10-CM | POA: Diagnosis not present

## 2015-05-04 DIAGNOSIS — R2689 Other abnormalities of gait and mobility: Secondary | ICD-10-CM | POA: Diagnosis not present

## 2015-05-04 DIAGNOSIS — R29890 Loss of height: Secondary | ICD-10-CM | POA: Diagnosis not present

## 2015-05-04 DIAGNOSIS — S32010A Wedge compression fracture of first lumbar vertebra, initial encounter for closed fracture: Secondary | ICD-10-CM | POA: Diagnosis not present

## 2015-05-04 DIAGNOSIS — Z888 Allergy status to other drugs, medicaments and biological substances status: Secondary | ICD-10-CM | POA: Diagnosis not present

## 2015-05-04 DIAGNOSIS — E559 Vitamin D deficiency, unspecified: Secondary | ICD-10-CM | POA: Diagnosis not present

## 2015-05-04 DIAGNOSIS — M4856XA Collapsed vertebra, not elsewhere classified, lumbar region, initial encounter for fracture: Secondary | ICD-10-CM | POA: Diagnosis not present

## 2015-05-04 DIAGNOSIS — M4806 Spinal stenosis, lumbar region: Secondary | ICD-10-CM | POA: Diagnosis not present

## 2015-05-04 DIAGNOSIS — E785 Hyperlipidemia, unspecified: Secondary | ICD-10-CM | POA: Diagnosis not present

## 2015-05-04 DIAGNOSIS — M810 Age-related osteoporosis without current pathological fracture: Secondary | ICD-10-CM | POA: Diagnosis not present

## 2015-05-04 DIAGNOSIS — Z66 Do not resuscitate: Secondary | ICD-10-CM | POA: Diagnosis not present

## 2015-05-04 DIAGNOSIS — R52 Pain, unspecified: Secondary | ICD-10-CM | POA: Diagnosis not present

## 2015-05-04 DIAGNOSIS — Z96611 Presence of right artificial shoulder joint: Secondary | ICD-10-CM | POA: Diagnosis not present

## 2015-05-04 DIAGNOSIS — I10 Essential (primary) hypertension: Secondary | ICD-10-CM | POA: Diagnosis not present

## 2015-05-04 DIAGNOSIS — M545 Low back pain: Secondary | ICD-10-CM | POA: Diagnosis not present

## 2015-05-04 DIAGNOSIS — Z1389 Encounter for screening for other disorder: Secondary | ICD-10-CM | POA: Diagnosis not present

## 2015-05-04 DIAGNOSIS — Z79899 Other long term (current) drug therapy: Secondary | ICD-10-CM | POA: Diagnosis not present

## 2015-05-04 DIAGNOSIS — K219 Gastro-esophageal reflux disease without esophagitis: Secondary | ICD-10-CM | POA: Diagnosis not present

## 2015-05-04 DIAGNOSIS — R32 Unspecified urinary incontinence: Secondary | ICD-10-CM | POA: Diagnosis not present

## 2015-05-04 DIAGNOSIS — Z881 Allergy status to other antibiotic agents status: Secondary | ICD-10-CM | POA: Diagnosis not present

## 2015-05-05 DIAGNOSIS — S32010A Wedge compression fracture of first lumbar vertebra, initial encounter for closed fracture: Secondary | ICD-10-CM | POA: Diagnosis not present

## 2015-05-06 DIAGNOSIS — S32010A Wedge compression fracture of first lumbar vertebra, initial encounter for closed fracture: Secondary | ICD-10-CM | POA: Diagnosis not present

## 2015-05-07 DIAGNOSIS — S32010A Wedge compression fracture of first lumbar vertebra, initial encounter for closed fracture: Secondary | ICD-10-CM | POA: Diagnosis not present

## 2015-05-08 DIAGNOSIS — S32010A Wedge compression fracture of first lumbar vertebra, initial encounter for closed fracture: Secondary | ICD-10-CM | POA: Diagnosis not present

## 2015-05-08 DIAGNOSIS — S32009A Unspecified fracture of unspecified lumbar vertebra, initial encounter for closed fracture: Secondary | ICD-10-CM | POA: Diagnosis not present

## 2015-05-09 DIAGNOSIS — M1990 Unspecified osteoarthritis, unspecified site: Secondary | ICD-10-CM | POA: Diagnosis not present

## 2015-05-09 DIAGNOSIS — Z9181 History of falling: Secondary | ICD-10-CM | POA: Diagnosis not present

## 2015-05-09 DIAGNOSIS — I10 Essential (primary) hypertension: Secondary | ICD-10-CM | POA: Diagnosis not present

## 2015-05-09 DIAGNOSIS — M8008XD Age-related osteoporosis with current pathological fracture, vertebra(e), subsequent encounter for fracture with routine healing: Secondary | ICD-10-CM | POA: Diagnosis not present

## 2015-05-09 DIAGNOSIS — Z79891 Long term (current) use of opiate analgesic: Secondary | ICD-10-CM | POA: Diagnosis not present

## 2015-05-10 DIAGNOSIS — I10 Essential (primary) hypertension: Secondary | ICD-10-CM | POA: Diagnosis not present

## 2015-05-10 DIAGNOSIS — Z79891 Long term (current) use of opiate analgesic: Secondary | ICD-10-CM | POA: Diagnosis not present

## 2015-05-10 DIAGNOSIS — M8008XD Age-related osteoporosis with current pathological fracture, vertebra(e), subsequent encounter for fracture with routine healing: Secondary | ICD-10-CM | POA: Diagnosis not present

## 2015-05-10 DIAGNOSIS — Z9181 History of falling: Secondary | ICD-10-CM | POA: Diagnosis not present

## 2015-05-10 DIAGNOSIS — M1990 Unspecified osteoarthritis, unspecified site: Secondary | ICD-10-CM | POA: Diagnosis not present

## 2015-05-12 DIAGNOSIS — I10 Essential (primary) hypertension: Secondary | ICD-10-CM | POA: Diagnosis not present

## 2015-05-12 DIAGNOSIS — M8008XD Age-related osteoporosis with current pathological fracture, vertebra(e), subsequent encounter for fracture with routine healing: Secondary | ICD-10-CM | POA: Diagnosis not present

## 2015-05-12 DIAGNOSIS — Z9181 History of falling: Secondary | ICD-10-CM | POA: Diagnosis not present

## 2015-05-12 DIAGNOSIS — M1990 Unspecified osteoarthritis, unspecified site: Secondary | ICD-10-CM | POA: Diagnosis not present

## 2015-05-12 DIAGNOSIS — Z79891 Long term (current) use of opiate analgesic: Secondary | ICD-10-CM | POA: Diagnosis not present

## 2015-05-16 ENCOUNTER — Other Ambulatory Visit: Payer: Self-pay | Admitting: Physician Assistant

## 2015-05-16 ENCOUNTER — Telehealth: Payer: Self-pay | Admitting: Physician Assistant

## 2015-05-16 DIAGNOSIS — I10 Essential (primary) hypertension: Secondary | ICD-10-CM | POA: Diagnosis not present

## 2015-05-16 DIAGNOSIS — S32009A Unspecified fracture of unspecified lumbar vertebra, initial encounter for closed fracture: Secondary | ICD-10-CM | POA: Insufficient documentation

## 2015-05-16 DIAGNOSIS — S32000D Wedge compression fracture of unspecified lumbar vertebra, subsequent encounter for fracture with routine healing: Secondary | ICD-10-CM

## 2015-05-16 DIAGNOSIS — Z9181 History of falling: Secondary | ICD-10-CM | POA: Diagnosis not present

## 2015-05-16 DIAGNOSIS — M8008XD Age-related osteoporosis with current pathological fracture, vertebra(e), subsequent encounter for fracture with routine healing: Secondary | ICD-10-CM | POA: Diagnosis not present

## 2015-05-16 DIAGNOSIS — Z79891 Long term (current) use of opiate analgesic: Secondary | ICD-10-CM | POA: Diagnosis not present

## 2015-05-16 DIAGNOSIS — M1990 Unspecified osteoarthritis, unspecified site: Secondary | ICD-10-CM | POA: Diagnosis not present

## 2015-05-16 MED ORDER — HYDROCODONE-ACETAMINOPHEN 5-325 MG PO TABS: 1.0000 | ORAL_TABLET | Freq: Four times a day (QID) | ORAL | Status: DC | PRN

## 2015-05-16 NOTE — Telephone Encounter (Signed)
Spoke with grandson. She was reaching for something in closet and felt a pop and then had tremendous pain. she went to [facility] after injury on 05/04/15. Per grandson fractured L1. She is in 10/10 pain. She is in rehab with home health. She does not feel like she can come in to office to be seen. Home health comes 3 times a week. Will give a small quanity of norco.

## 2015-05-16 NOTE — Telephone Encounter (Signed)
Mallory Starch, Ms. Buchannon(Daughter) called. Mom needs a refill on Hydrocodone-acetaminophen-GA-5-325mg  for pain. This is not a med that you have prescribed. Daughter states her mom cannot come in for an appt due to her back injury she has to be transported by ambulance and she's not willing to put her Mom through this. She has asked that you give her a call.  She wants this med today because nephew is town until 11:00a.m today and she's 2 hrs away. I had explained everthing to  [place] and she advised that pt come in for appt. Thank you.

## 2015-05-18 DIAGNOSIS — M8008XD Age-related osteoporosis with current pathological fracture, vertebra(e), subsequent encounter for fracture with routine healing: Secondary | ICD-10-CM | POA: Diagnosis not present

## 2015-05-18 DIAGNOSIS — Z9181 History of falling: Secondary | ICD-10-CM | POA: Diagnosis not present

## 2015-05-18 DIAGNOSIS — M1990 Unspecified osteoarthritis, unspecified site: Secondary | ICD-10-CM | POA: Diagnosis not present

## 2015-05-18 DIAGNOSIS — I10 Essential (primary) hypertension: Secondary | ICD-10-CM | POA: Diagnosis not present

## 2015-05-18 DIAGNOSIS — Z79891 Long term (current) use of opiate analgesic: Secondary | ICD-10-CM | POA: Diagnosis not present

## 2015-05-20 DIAGNOSIS — Z9181 History of falling: Secondary | ICD-10-CM | POA: Diagnosis not present

## 2015-05-20 DIAGNOSIS — M1990 Unspecified osteoarthritis, unspecified site: Secondary | ICD-10-CM | POA: Diagnosis not present

## 2015-05-20 DIAGNOSIS — I10 Essential (primary) hypertension: Secondary | ICD-10-CM | POA: Diagnosis not present

## 2015-05-20 DIAGNOSIS — Z79891 Long term (current) use of opiate analgesic: Secondary | ICD-10-CM | POA: Diagnosis not present

## 2015-05-20 DIAGNOSIS — M8008XD Age-related osteoporosis with current pathological fracture, vertebra(e), subsequent encounter for fracture with routine healing: Secondary | ICD-10-CM | POA: Diagnosis not present

## 2015-05-24 DIAGNOSIS — I10 Essential (primary) hypertension: Secondary | ICD-10-CM | POA: Diagnosis not present

## 2015-05-24 DIAGNOSIS — Z9181 History of falling: Secondary | ICD-10-CM | POA: Diagnosis not present

## 2015-05-24 DIAGNOSIS — M8008XD Age-related osteoporosis with current pathological fracture, vertebra(e), subsequent encounter for fracture with routine healing: Secondary | ICD-10-CM | POA: Diagnosis not present

## 2015-05-24 DIAGNOSIS — Z79891 Long term (current) use of opiate analgesic: Secondary | ICD-10-CM | POA: Diagnosis not present

## 2015-05-24 DIAGNOSIS — M1990 Unspecified osteoarthritis, unspecified site: Secondary | ICD-10-CM | POA: Diagnosis not present

## 2015-05-25 DIAGNOSIS — M1990 Unspecified osteoarthritis, unspecified site: Secondary | ICD-10-CM | POA: Diagnosis not present

## 2015-05-25 DIAGNOSIS — I10 Essential (primary) hypertension: Secondary | ICD-10-CM | POA: Diagnosis not present

## 2015-05-25 DIAGNOSIS — Z79891 Long term (current) use of opiate analgesic: Secondary | ICD-10-CM | POA: Diagnosis not present

## 2015-05-25 DIAGNOSIS — Z9181 History of falling: Secondary | ICD-10-CM | POA: Diagnosis not present

## 2015-05-25 DIAGNOSIS — M8008XD Age-related osteoporosis with current pathological fracture, vertebra(e), subsequent encounter for fracture with routine healing: Secondary | ICD-10-CM | POA: Diagnosis not present

## 2015-05-27 DIAGNOSIS — M8008XD Age-related osteoporosis with current pathological fracture, vertebra(e), subsequent encounter for fracture with routine healing: Secondary | ICD-10-CM | POA: Diagnosis not present

## 2015-05-27 DIAGNOSIS — Z9181 History of falling: Secondary | ICD-10-CM | POA: Diagnosis not present

## 2015-05-27 DIAGNOSIS — M1990 Unspecified osteoarthritis, unspecified site: Secondary | ICD-10-CM | POA: Diagnosis not present

## 2015-05-27 DIAGNOSIS — I10 Essential (primary) hypertension: Secondary | ICD-10-CM | POA: Diagnosis not present

## 2015-05-27 DIAGNOSIS — Z79891 Long term (current) use of opiate analgesic: Secondary | ICD-10-CM | POA: Diagnosis not present

## 2015-05-30 DIAGNOSIS — I10 Essential (primary) hypertension: Secondary | ICD-10-CM | POA: Diagnosis not present

## 2015-05-30 DIAGNOSIS — M8008XD Age-related osteoporosis with current pathological fracture, vertebra(e), subsequent encounter for fracture with routine healing: Secondary | ICD-10-CM | POA: Diagnosis not present

## 2015-05-30 DIAGNOSIS — M1990 Unspecified osteoarthritis, unspecified site: Secondary | ICD-10-CM | POA: Diagnosis not present

## 2015-05-30 DIAGNOSIS — Z9181 History of falling: Secondary | ICD-10-CM | POA: Diagnosis not present

## 2015-05-30 DIAGNOSIS — Z79891 Long term (current) use of opiate analgesic: Secondary | ICD-10-CM | POA: Diagnosis not present

## 2015-06-01 DIAGNOSIS — Z9181 History of falling: Secondary | ICD-10-CM | POA: Diagnosis not present

## 2015-06-01 DIAGNOSIS — Z79891 Long term (current) use of opiate analgesic: Secondary | ICD-10-CM | POA: Diagnosis not present

## 2015-06-01 DIAGNOSIS — M8008XD Age-related osteoporosis with current pathological fracture, vertebra(e), subsequent encounter for fracture with routine healing: Secondary | ICD-10-CM | POA: Diagnosis not present

## 2015-06-01 DIAGNOSIS — I10 Essential (primary) hypertension: Secondary | ICD-10-CM | POA: Diagnosis not present

## 2015-06-01 DIAGNOSIS — M1990 Unspecified osteoarthritis, unspecified site: Secondary | ICD-10-CM | POA: Diagnosis not present

## 2015-06-03 DIAGNOSIS — M8008XD Age-related osteoporosis with current pathological fracture, vertebra(e), subsequent encounter for fracture with routine healing: Secondary | ICD-10-CM | POA: Diagnosis not present

## 2015-06-03 DIAGNOSIS — I10 Essential (primary) hypertension: Secondary | ICD-10-CM | POA: Diagnosis not present

## 2015-06-03 DIAGNOSIS — M1990 Unspecified osteoarthritis, unspecified site: Secondary | ICD-10-CM | POA: Diagnosis not present

## 2015-06-03 DIAGNOSIS — Z9181 History of falling: Secondary | ICD-10-CM | POA: Diagnosis not present

## 2015-06-03 DIAGNOSIS — Z79891 Long term (current) use of opiate analgesic: Secondary | ICD-10-CM | POA: Diagnosis not present

## 2015-06-06 DIAGNOSIS — M1990 Unspecified osteoarthritis, unspecified site: Secondary | ICD-10-CM | POA: Diagnosis not present

## 2015-06-06 DIAGNOSIS — Z79891 Long term (current) use of opiate analgesic: Secondary | ICD-10-CM | POA: Diagnosis not present

## 2015-06-06 DIAGNOSIS — I10 Essential (primary) hypertension: Secondary | ICD-10-CM | POA: Diagnosis not present

## 2015-06-06 DIAGNOSIS — Z9181 History of falling: Secondary | ICD-10-CM | POA: Diagnosis not present

## 2015-06-06 DIAGNOSIS — M8008XD Age-related osteoporosis with current pathological fracture, vertebra(e), subsequent encounter for fracture with routine healing: Secondary | ICD-10-CM | POA: Diagnosis not present

## 2015-06-07 DIAGNOSIS — I10 Essential (primary) hypertension: Secondary | ICD-10-CM | POA: Diagnosis not present

## 2015-06-07 DIAGNOSIS — M8008XD Age-related osteoporosis with current pathological fracture, vertebra(e), subsequent encounter for fracture with routine healing: Secondary | ICD-10-CM | POA: Diagnosis not present

## 2015-06-07 DIAGNOSIS — M1990 Unspecified osteoarthritis, unspecified site: Secondary | ICD-10-CM | POA: Diagnosis not present

## 2015-06-07 DIAGNOSIS — Z79891 Long term (current) use of opiate analgesic: Secondary | ICD-10-CM | POA: Diagnosis not present

## 2015-06-07 DIAGNOSIS — Z9181 History of falling: Secondary | ICD-10-CM | POA: Diagnosis not present

## 2015-06-08 DIAGNOSIS — Z9181 History of falling: Secondary | ICD-10-CM | POA: Diagnosis not present

## 2015-06-08 DIAGNOSIS — M8008XD Age-related osteoporosis with current pathological fracture, vertebra(e), subsequent encounter for fracture with routine healing: Secondary | ICD-10-CM | POA: Diagnosis not present

## 2015-06-08 DIAGNOSIS — Z79891 Long term (current) use of opiate analgesic: Secondary | ICD-10-CM | POA: Diagnosis not present

## 2015-06-08 DIAGNOSIS — I10 Essential (primary) hypertension: Secondary | ICD-10-CM | POA: Diagnosis not present

## 2015-06-08 DIAGNOSIS — M1990 Unspecified osteoarthritis, unspecified site: Secondary | ICD-10-CM | POA: Diagnosis not present

## 2015-06-16 DIAGNOSIS — M8008XD Age-related osteoporosis with current pathological fracture, vertebra(e), subsequent encounter for fracture with routine healing: Secondary | ICD-10-CM | POA: Diagnosis not present

## 2015-06-16 DIAGNOSIS — I10 Essential (primary) hypertension: Secondary | ICD-10-CM | POA: Diagnosis not present

## 2015-06-16 DIAGNOSIS — Z9181 History of falling: Secondary | ICD-10-CM | POA: Diagnosis not present

## 2015-06-16 DIAGNOSIS — M1990 Unspecified osteoarthritis, unspecified site: Secondary | ICD-10-CM | POA: Diagnosis not present

## 2015-06-16 DIAGNOSIS — Z79891 Long term (current) use of opiate analgesic: Secondary | ICD-10-CM | POA: Diagnosis not present

## 2015-06-22 ENCOUNTER — Other Ambulatory Visit: Payer: Self-pay | Admitting: Physician Assistant

## 2015-06-27 ENCOUNTER — Telehealth: Payer: Self-pay

## 2015-06-27 ENCOUNTER — Other Ambulatory Visit: Payer: Self-pay

## 2015-06-27 DIAGNOSIS — J309 Allergic rhinitis, unspecified: Secondary | ICD-10-CM

## 2015-06-27 DIAGNOSIS — I10 Essential (primary) hypertension: Secondary | ICD-10-CM

## 2015-06-27 MED ORDER — DICLOFENAC SODIUM 1 % TD GEL: 2.0000 g | Freq: Four times a day (QID) | TRANSDERMAL | Status: DC

## 2015-06-27 MED ORDER — BENAZEPRIL HCL 40 MG PO TABS: 40.0000 mg | ORAL_TABLET | Freq: Every day | ORAL | Status: DC

## 2015-06-27 MED ORDER — MIRABEGRON ER 25 MG PO TB24: 25.0000 mg | ORAL_TABLET | Freq: Every day | ORAL | Status: DC

## 2015-06-27 NOTE — Telephone Encounter (Signed)
Yes ok to refill 

## 2015-07-13 ENCOUNTER — Encounter: Payer: Self-pay | Admitting: Physician Assistant

## 2015-07-13 ENCOUNTER — Ambulatory Visit (INDEPENDENT_AMBULATORY_CARE_PROVIDER_SITE_OTHER): Payer: Medicare Other | Admitting: Physician Assistant

## 2015-07-13 ENCOUNTER — Ambulatory Visit (INDEPENDENT_AMBULATORY_CARE_PROVIDER_SITE_OTHER): Payer: Medicare Other

## 2015-07-13 VITALS — BP 169/63 | HR 76 | Ht 59.0 in

## 2015-07-13 DIAGNOSIS — R0602 Shortness of breath: Secondary | ICD-10-CM | POA: Diagnosis not present

## 2015-07-13 DIAGNOSIS — J4541 Moderate persistent asthma with (acute) exacerbation: Secondary | ICD-10-CM | POA: Diagnosis not present

## 2015-07-13 DIAGNOSIS — R059 Cough, unspecified: Secondary | ICD-10-CM

## 2015-07-13 DIAGNOSIS — R05 Cough: Secondary | ICD-10-CM

## 2015-07-13 DIAGNOSIS — R062 Wheezing: Secondary | ICD-10-CM

## 2015-07-13 DIAGNOSIS — J208 Acute bronchitis due to other specified organisms: Secondary | ICD-10-CM | POA: Diagnosis not present

## 2015-07-13 MED ORDER — ALBUTEROL SULFATE (2.5 MG/3ML) 0.083% IN NEBU: 2.5000 mg | INHALATION_SOLUTION | Freq: Four times a day (QID) | RESPIRATORY_TRACT | Status: DC | PRN

## 2015-07-13 MED ORDER — IPRATROPIUM-ALBUTEROL 0.5-2.5 (3) MG/3ML IN SOLN
3.0000 mL | Freq: Once | RESPIRATORY_TRACT | Status: AC
  Administered 2015-07-13: 3 mL via RESPIRATORY_TRACT

## 2015-07-13 MED ORDER — AZITHROMYCIN 250 MG PO TABS: ORAL_TABLET | ORAL | Status: DC

## 2015-07-13 MED ORDER — AMBULATORY NON FORMULARY MEDICATION
Status: DC
Start: 2015-07-13 — End: 2015-10-12

## 2015-07-13 NOTE — Progress Notes (Addendum)
   Subjective:    Patient ID: Mallory Reeves, female    DOB: [birth year], 82 y.o.   MRN: [id number]  HPI Pt is 82 yo female who presents with caregiver to office today due to cough for the last 6 days. She feels like she is getting progressively worse. Cough is productive with yellow to clear sputum. She is having some wheezing and SOB. No fever. She has had some chills. No headache, ear pain or ST. She does feel like she is getting tired easily. She has taken nothing for improvement.   Review of Systems See HPI.    Objective:   Physical Exam  Constitutional: She is oriented to person, place, and time. She appears well-developed and well-nourished.  HENT:  Head: Normocephalic and atraumatic.  Right Ear: External ear normal.  Left Ear: External ear normal.  Nose: Nose normal.  Mouth/Throat: Oropharynx is clear and moist. No oropharyngeal exudate.  TM's clear.  Bilateral nares turbinates red and swollen.  Eyes: Conjunctivae are normal. Right eye exhibits no discharge. Left eye exhibits no discharge.  Neck: Normal range of motion. Neck supple.  Cardiovascular: Normal rate, regular rhythm and normal heart sounds.   Pulmonary/Chest:  Decreased effort. PUlse Ox 94 percent. Wheezing and rhonchi more in right than left.    Lymphadenopathy:    She has no cervical adenopathy.  Neurological: She is alert and oriented to person, place, and time.  Skin: Skin is dry.  Psychiatric: She has a normal mood and affect. Her behavior is normal.          Assessment & Plan:  Acute asthmatic bronchitis/wheezing/SOB- pulse ox 94 percent. duoneb given in office with some symptomatic improvement. CXR showed peribronchial thickening and interstitual inflammation. Could be first signs of pneumonia. Called Brenda(pt's daughter) and made aware of results. Sent zpak to pharmacy. Will get pt set up with nebulizer from home health. Solution sent to pharmacy. Follow up if not improving in next 2 days.

## 2015-07-18 ENCOUNTER — Other Ambulatory Visit: Payer: Self-pay | Admitting: *Deleted

## 2015-07-18 ENCOUNTER — Telehealth: Payer: Self-pay | Admitting: *Deleted

## 2015-07-18 MED ORDER — PREDNISONE 50 MG PO TABS: 50.0000 mg | ORAL_TABLET | Freq: Every day | ORAL | Status: DC

## 2015-07-18 NOTE — Telephone Encounter (Signed)
Ok to send prednisone 50mg  for 5 days #5 NRF to see if some of cough from inflammation.

## 2015-07-18 NOTE — Telephone Encounter (Signed)
Pt's daughter notified of rx.

## 2015-07-18 NOTE — Telephone Encounter (Signed)
Pt's daughter left vm wanting to know what the plan of care is going to be for her mom going forward.  She stated that she has taken all her meds & is still using the nebulizer but her caretaker said that she seems "no better and no worse" and that she is not eating well.

## 2015-07-19 ENCOUNTER — Telehealth: Payer: Self-pay

## 2015-07-19 NOTE — Telephone Encounter (Signed)
[place] daughter will schedule an appointment.

## 2015-07-19 NOTE — Telephone Encounter (Signed)
Yes come in. We need to see her if not getting better.

## 2015-07-20 ENCOUNTER — Encounter: Payer: Self-pay | Admitting: Physician Assistant

## 2015-07-20 ENCOUNTER — Ambulatory Visit (INDEPENDENT_AMBULATORY_CARE_PROVIDER_SITE_OTHER): Payer: Medicare Other | Admitting: Physician Assistant

## 2015-07-20 VITALS — BP 163/71 | HR 102 | Ht 59.0 in | Wt 140.0 lb

## 2015-07-20 DIAGNOSIS — J4521 Mild intermittent asthma with (acute) exacerbation: Secondary | ICD-10-CM

## 2015-07-20 DIAGNOSIS — J208 Acute bronchitis due to other specified organisms: Secondary | ICD-10-CM

## 2015-07-20 MED ORDER — ALBUTEROL SULFATE (2.5 MG/3ML) 0.083% IN NEBU: 2.5000 mg | INHALATION_SOLUTION | Freq: Four times a day (QID) | RESPIRATORY_TRACT | Status: DC | PRN

## 2015-07-20 MED ORDER — GUAIFENESIN-CODEINE 100-10 MG/5ML PO SYRP: 5.0000 mL | ORAL_SOLUTION | Freq: Three times a day (TID) | ORAL | Status: DC | PRN

## 2015-07-20 NOTE — Patient Instructions (Signed)
Cough can last for a few weeks after infection. Pulse ox reassuring, lung exam reassuring, vitals reassuring.  Given cheratussin for cough. Watch for sedation.  Finish prednisone.  Continue nebulizer treatments.   Acute Bronchitis Bronchitis is inflammation of the airways that extend from the windpipe into the lungs (bronchi). The inflammation often causes mucus to develop. This leads to a cough, which is the most common symptom of bronchitis.  In acute bronchitis, the condition usually develops suddenly and goes away over time, usually in a couple weeks. Smoking, allergies, and asthma can make bronchitis worse. Repeated episodes of bronchitis may cause further lung problems.  CAUSES Acute bronchitis is most often caused by the same virus that causes a cold. The virus can spread from person to person (contagious) through coughing, sneezing, and touching contaminated objects. SIGNS AND SYMPTOMS   Cough.   Fever.   Coughing up mucus.   Body aches.   Chest congestion.   Chills.   Shortness of breath.   Sore throat.  DIAGNOSIS  Acute bronchitis is usually diagnosed through a physical exam. Your health care provider will also ask you questions about your medical history. Tests, such as chest X-rays, are sometimes done to rule out other conditions.  TREATMENT  Acute bronchitis usually goes away in a couple weeks. Oftentimes, no medical treatment is necessary. Medicines are sometimes given for relief of fever or cough. Antibiotic medicines are usually not needed but may be prescribed in certain situations. In some cases, an inhaler may be recommended to help reduce shortness of breath and control the cough. A cool mist vaporizer may also be used to help thin bronchial secretions and make it easier to clear the chest.  HOME CARE INSTRUCTIONS  Get plenty of rest.   Drink enough fluids to keep your urine clear or pale yellow (unless you have a medical condition that requires fluid  restriction). Increasing fluids may help thin your respiratory secretions (sputum) and reduce chest congestion, and it will prevent dehydration.   Take medicines only as directed by your health care provider.  If you were prescribed an antibiotic medicine, finish it all even if you start to feel better.  Avoid smoking and secondhand smoke. Exposure to cigarette smoke or irritating chemicals will make bronchitis worse. If you are a smoker, consider using nicotine gum or skin patches to help control withdrawal symptoms. Quitting smoking will help your lungs heal faster.   Reduce the chances of another bout of acute bronchitis by washing your hands frequently, avoiding people with cold symptoms, and trying not to touch your hands to your mouth, nose, or eyes.   Keep all follow-up visits as directed by your health care provider.  SEEK MEDICAL CARE IF: Your symptoms do not improve after 1 week of treatment.  SEEK IMMEDIATE MEDICAL CARE IF:  You develop an increased fever or chills.   You have chest pain.   You have severe shortness of breath.  You have bloody sputum.   You develop dehydration.  You faint or repeatedly feel like you are going to pass out.  You develop repeated vomiting.  You develop a severe headache. MAKE SURE YOU:   Understand these instructions.  Will watch your condition.  Will get help right away if you are not doing well or get worse.   This information is not intended to replace advice given to you by your health care provider. Make sure you discuss any questions you have with your health care provider.   Document  Released: 05/10/2004 Document Revised: 04/23/2014 Document Reviewed: 09/23/2012 Elsevier Interactive Patient Education [employer].

## 2015-07-20 NOTE — Progress Notes (Addendum)
   Subjective:    Patient ID: Mallory Reeves, female    DOB: Nov 29, 1916, 82 y.o.   MRN: [id number]  HPI Pt is a 82 yo female who presents to the clinic with caregiver. She was seen on 07/13/15 with acute asthmatic bronchitis. CXR showed peribronchial thickening. She has finished zpak. Continues to take prednisone and albuterol nebulizers. She does feel like nebulizer is helping a lot. Denies any fever, chills, body aches. She is feeling better but still has cough. She continues to get stuff up from cough.    Review of Systems See HPI.    Objective:   Physical Exam  Constitutional: She is oriented to person, place, and time. She appears well-developed and well-nourished.  HENT:  Head: Normocephalic and atraumatic.  Right Ear: External ear normal.  Left Ear: External ear normal.  Nose: Nose normal.  Mouth/Throat: Oropharynx is clear and moist. No oropharyngeal exudate.  TM's clear.  Negative for sinus pressure.  Bilateral nasal turbinates red and swollen.    Eyes: Conjunctivae are normal. Right eye exhibits no discharge.  Neck: Normal range of motion. Neck supple.  Cardiovascular: Regular rhythm and normal heart sounds.   Tachycardia at 102.   Pulmonary/Chest: Effort normal and breath sounds normal.  No wheezing or rhonchi heard today.  No crackles.  Pulse ox 96 percents.   Lymphadenopathy:    She has no cervical adenopathy.  Neurological: She is alert and oriented to person, place, and time.  Skin: Skin is dry.  Psychiatric: She has a normal mood and affect. Her behavior is normal.          Assessment & Plan:  Acute asthmatic bronchitis- reassured patient that Cough can last for a few weeks after infection. Pulse ox reassuring with improvement to 96 percent, lung exam reassuring, vitals reassuring.  Given cheratussin for cough. Watch for sedation.  Finish prednisone.  Continue nebulizer treatments. Follow up as needed.

## 2015-08-17 DIAGNOSIS — M25561 Pain in right knee: Secondary | ICD-10-CM | POA: Diagnosis not present

## 2015-08-17 DIAGNOSIS — M170 Bilateral primary osteoarthritis of knee: Secondary | ICD-10-CM | POA: Diagnosis not present

## 2015-08-17 DIAGNOSIS — M25562 Pain in left knee: Secondary | ICD-10-CM | POA: Diagnosis not present

## 2015-08-31 DIAGNOSIS — L821 Other seborrheic keratosis: Secondary | ICD-10-CM | POA: Diagnosis not present

## 2015-08-31 DIAGNOSIS — Z08 Encounter for follow-up examination after completed treatment for malignant neoplasm: Secondary | ICD-10-CM | POA: Diagnosis not present

## 2015-08-31 DIAGNOSIS — Z85828 Personal history of other malignant neoplasm of skin: Secondary | ICD-10-CM | POA: Diagnosis not present

## 2015-09-14 DIAGNOSIS — H43393 Other vitreous opacities, bilateral: Secondary | ICD-10-CM | POA: Diagnosis not present

## 2015-10-12 ENCOUNTER — Encounter: Payer: Self-pay | Admitting: Physician Assistant

## 2015-10-12 ENCOUNTER — Ambulatory Visit (INDEPENDENT_AMBULATORY_CARE_PROVIDER_SITE_OTHER): Payer: Medicare Other | Admitting: Physician Assistant

## 2015-10-12 VITALS — BP 142/57 | HR 70 | Ht 59.0 in | Wt 142.0 lb

## 2015-10-12 DIAGNOSIS — N3281 Overactive bladder: Secondary | ICD-10-CM | POA: Diagnosis not present

## 2015-10-12 DIAGNOSIS — I10 Essential (primary) hypertension: Secondary | ICD-10-CM

## 2015-10-12 DIAGNOSIS — R451 Restlessness and agitation: Secondary | ICD-10-CM | POA: Diagnosis not present

## 2015-10-12 MED ORDER — DICLOFENAC SODIUM 1 % TD GEL: 2.0000 g | Freq: Four times a day (QID) | TRANSDERMAL | Status: AC

## 2015-10-12 MED ORDER — MIRABEGRON ER 50 MG PO TB24: 50.0000 mg | ORAL_TABLET | Freq: Every day | ORAL | Status: DC

## 2015-10-12 MED ORDER — CLONAZEPAM 0.125 MG PO TBDP: ORAL_TABLET | ORAL | Status: AC

## 2015-10-12 NOTE — Progress Notes (Signed)
   Subjective:    Patient ID: Mallory Reeves, female    DOB: November 24, 1916, 82 y.o.   MRN: [id number]  HPI Pt is a 82 yo female who presents to the clinic with caregiver. She comes in today wanting something for agitation. Since her fall earlier this year her family has made someone stay with her 24/7. Most of the time it is her granddaughter with her son. The little boy is very loud and leaves toys every where. This makes her very agitated. She would like something to calm her down. She feels like effected her BP. Denies any CP, palpitations, headaches or dizziness.   She does not feel like mrybetriq 25mg  is helping that much. She has stopped using right now. She continues to have to use the bathroom a lot.    Review of Systems  All other systems reviewed and are negative.      Objective:   Physical Exam  Constitutional: She is oriented to person, place, and time. She appears well-developed and well-nourished.  HENT:  Head: Normocephalic and atraumatic.  Cardiovascular: Normal rate, regular rhythm and normal heart sounds.   Pulmonary/Chest: Effort normal and breath sounds normal.  Neurological: She is alert and oriented to person, place, and time.  Psychiatric: She has a normal mood and affect. Her behavior is normal.          Assessment & Plan:  Agitation- restarted klonapin .125mg  as needed up to twice a day. Discussed having a conversation with care givers on having a space that is just hers. Encouraged her to sit on deck and get out more.   OAB- increased mybetriq to 50mg  daily. Discussed realistic expectations. Follow up in 3 months.    HTN- recheck was much better and under 150/90. Continue on lotensin. Follow up 3 months. Continue to monitor.   Spent 30 minutes with patient counseling her regarding treatment plan.

## 2015-10-14 ENCOUNTER — Encounter: Payer: Self-pay | Admitting: Physician Assistant

## 2016-01-04 ENCOUNTER — Ambulatory Visit (INDEPENDENT_AMBULATORY_CARE_PROVIDER_SITE_OTHER): Payer: Medicare Other | Admitting: Physician Assistant

## 2016-01-04 VITALS — BP 137/55 | HR 75

## 2016-01-04 DIAGNOSIS — J309 Allergic rhinitis, unspecified: Secondary | ICD-10-CM

## 2016-01-04 DIAGNOSIS — Z23 Encounter for immunization: Secondary | ICD-10-CM

## 2016-01-04 DIAGNOSIS — I10 Essential (primary) hypertension: Secondary | ICD-10-CM

## 2016-01-04 MED ORDER — AZELASTINE HCL 0.1 % NA SOLN: 2.0000 | Freq: Every day | NASAL | 6 refills | Status: DC

## 2016-01-04 MED ORDER — OLOPATADINE HCL 0.2 % OP SOLN: OPHTHALMIC | 2 refills | Status: AC

## 2016-01-04 MED ORDER — MIRABEGRON ER 50 MG PO TB24: 50.0000 mg | ORAL_TABLET | Freq: Every day | ORAL | 1 refills | Status: DC

## 2016-01-04 MED ORDER — TUBERCULIN PPD 5 UNIT/0.1ML ID SOLN: 5.0000 [IU] | Freq: Once | INTRADERMAL | Status: DC

## 2016-01-04 MED ORDER — BENAZEPRIL HCL 40 MG PO TABS: 40.0000 mg | ORAL_TABLET | Freq: Every day | ORAL | 1 refills | Status: DC

## 2016-01-04 NOTE — Progress Notes (Signed)
Mrs. Newnham presents to clinic for a PPD placement.  Denies SOB, redness, swelling and past allergic reaction.  Pt tolerated intradermal injection in the left forearm well without any complications. She will be moving into assistant living facility on Saturday.  She asked that all her meds be up dated with refills.  she was Rx fluticasone and she said it burns her nose. So she is asking for azelastine hydrochloride nasal spray instead and that is to be sent to [facility] all other meds [facility]. The form for the assistant living is in your box. She would like for this to be completed so that she can pick it up on Friday when she comes in for her TB reading.  No further questions at this time. - [facility]

## 2016-01-05 DIAGNOSIS — M2041 Other hammer toe(s) (acquired), right foot: Secondary | ICD-10-CM | POA: Diagnosis not present

## 2016-01-05 DIAGNOSIS — M24572 Contracture, left ankle: Secondary | ICD-10-CM | POA: Diagnosis not present

## 2016-01-05 DIAGNOSIS — M2012 Hallux valgus (acquired), left foot: Secondary | ICD-10-CM | POA: Diagnosis not present

## 2016-01-05 DIAGNOSIS — M2042 Other hammer toe(s) (acquired), left foot: Secondary | ICD-10-CM | POA: Diagnosis not present

## 2016-01-05 DIAGNOSIS — M24571 Contracture, right ankle: Secondary | ICD-10-CM | POA: Diagnosis not present

## 2016-01-05 DIAGNOSIS — M2011 Hallux valgus (acquired), right foot: Secondary | ICD-10-CM | POA: Diagnosis not present

## 2016-01-06 ENCOUNTER — Ambulatory Visit (INDEPENDENT_AMBULATORY_CARE_PROVIDER_SITE_OTHER): Payer: Medicare Other | Admitting: Physician Assistant

## 2016-01-06 ENCOUNTER — Other Ambulatory Visit: Payer: Self-pay

## 2016-01-06 VITALS — BP 146/59 | HR 68

## 2016-01-06 DIAGNOSIS — J309 Allergic rhinitis, unspecified: Secondary | ICD-10-CM

## 2016-01-06 DIAGNOSIS — I10 Essential (primary) hypertension: Secondary | ICD-10-CM | POA: Diagnosis not present

## 2016-01-06 DIAGNOSIS — Z7689 Persons encountering health services in other specified circumstances: Secondary | ICD-10-CM

## 2016-01-06 DIAGNOSIS — Z111 Encounter for screening for respiratory tuberculosis: Secondary | ICD-10-CM

## 2016-01-06 LAB — TB SKIN TEST
Induration: 0 mm
TB Skin Test: NEGATIVE

## 2016-01-06 MED ORDER — BENAZEPRIL HCL 40 MG PO TABS: 40.0000 mg | ORAL_TABLET | Freq: Every day | ORAL | 1 refills | Status: AC

## 2016-01-06 MED ORDER — AZELASTINE HCL 0.1 % NA SOLN: 2.0000 | Freq: Every day | NASAL | 6 refills | Status: DC

## 2016-01-06 MED ORDER — MIRABEGRON ER 50 MG PO TB24: 50.0000 mg | ORAL_TABLET | Freq: Every day | ORAL | 1 refills | Status: AC

## 2016-01-06 MED ORDER — AZELASTINE HCL 0.1 % NA SOLN: 2.0000 | Freq: Every day | NASAL | 6 refills | Status: AC

## 2016-01-06 NOTE — Progress Notes (Signed)
Sent refills

## 2016-01-06 NOTE — Progress Notes (Signed)
Pt came into clinic today for PPD read. Test was negative. Pt results were faxed to nursing home that she is moving to. No further questions/concerns.

## 2016-01-11 ENCOUNTER — Ambulatory Visit: Payer: Medicare Other | Admitting: Physician Assistant

## 2016-01-12 DIAGNOSIS — I10 Essential (primary) hypertension: Secondary | ICD-10-CM | POA: Diagnosis not present

## 2016-01-12 DIAGNOSIS — M6281 Muscle weakness (generalized): Secondary | ICD-10-CM | POA: Diagnosis not present

## 2016-01-18 DIAGNOSIS — I10 Essential (primary) hypertension: Secondary | ICD-10-CM | POA: Diagnosis not present

## 2016-01-18 DIAGNOSIS — M810 Age-related osteoporosis without current pathological fracture: Secondary | ICD-10-CM | POA: Diagnosis not present

## 2016-01-18 DIAGNOSIS — D649 Anemia, unspecified: Secondary | ICD-10-CM | POA: Diagnosis not present

## 2016-01-19 DIAGNOSIS — M25561 Pain in right knee: Secondary | ICD-10-CM | POA: Diagnosis not present

## 2016-01-19 DIAGNOSIS — M545 Low back pain: Secondary | ICD-10-CM | POA: Diagnosis not present

## 2016-01-19 DIAGNOSIS — E559 Vitamin D deficiency, unspecified: Secondary | ICD-10-CM | POA: Diagnosis not present

## 2016-01-19 DIAGNOSIS — M6281 Muscle weakness (generalized): Secondary | ICD-10-CM | POA: Diagnosis not present

## 2016-01-19 DIAGNOSIS — M25562 Pain in left knee: Secondary | ICD-10-CM | POA: Diagnosis not present

## 2016-01-19 DIAGNOSIS — R278 Other lack of coordination: Secondary | ICD-10-CM | POA: Diagnosis not present

## 2016-01-24 DIAGNOSIS — M6281 Muscle weakness (generalized): Secondary | ICD-10-CM | POA: Diagnosis not present

## 2016-01-24 DIAGNOSIS — M25561 Pain in right knee: Secondary | ICD-10-CM | POA: Diagnosis not present

## 2016-01-24 DIAGNOSIS — R278 Other lack of coordination: Secondary | ICD-10-CM | POA: Diagnosis not present

## 2016-01-24 DIAGNOSIS — M545 Low back pain: Secondary | ICD-10-CM | POA: Diagnosis not present

## 2016-01-24 DIAGNOSIS — M25562 Pain in left knee: Secondary | ICD-10-CM | POA: Diagnosis not present

## 2016-01-25 DIAGNOSIS — M545 Low back pain: Secondary | ICD-10-CM | POA: Diagnosis not present

## 2016-01-25 DIAGNOSIS — M6281 Muscle weakness (generalized): Secondary | ICD-10-CM | POA: Diagnosis not present

## 2016-01-25 DIAGNOSIS — R278 Other lack of coordination: Secondary | ICD-10-CM | POA: Diagnosis not present

## 2016-01-25 DIAGNOSIS — M25561 Pain in right knee: Secondary | ICD-10-CM | POA: Diagnosis not present

## 2016-01-25 DIAGNOSIS — M25562 Pain in left knee: Secondary | ICD-10-CM | POA: Diagnosis not present

## 2016-01-26 DIAGNOSIS — M545 Low back pain: Secondary | ICD-10-CM | POA: Diagnosis not present

## 2016-01-26 DIAGNOSIS — R278 Other lack of coordination: Secondary | ICD-10-CM | POA: Diagnosis not present

## 2016-01-26 DIAGNOSIS — M6281 Muscle weakness (generalized): Secondary | ICD-10-CM | POA: Diagnosis not present

## 2016-01-26 DIAGNOSIS — M25562 Pain in left knee: Secondary | ICD-10-CM | POA: Diagnosis not present

## 2016-01-26 DIAGNOSIS — M25561 Pain in right knee: Secondary | ICD-10-CM | POA: Diagnosis not present

## 2016-01-31 DIAGNOSIS — M25561 Pain in right knee: Secondary | ICD-10-CM | POA: Diagnosis not present

## 2016-01-31 DIAGNOSIS — M25562 Pain in left knee: Secondary | ICD-10-CM | POA: Diagnosis not present

## 2016-01-31 DIAGNOSIS — M545 Low back pain: Secondary | ICD-10-CM | POA: Diagnosis not present

## 2016-01-31 DIAGNOSIS — R278 Other lack of coordination: Secondary | ICD-10-CM | POA: Diagnosis not present

## 2016-01-31 DIAGNOSIS — M6281 Muscle weakness (generalized): Secondary | ICD-10-CM | POA: Diagnosis not present

## 2016-02-01 DIAGNOSIS — R278 Other lack of coordination: Secondary | ICD-10-CM | POA: Diagnosis not present

## 2016-02-01 DIAGNOSIS — M25562 Pain in left knee: Secondary | ICD-10-CM | POA: Diagnosis not present

## 2016-02-01 DIAGNOSIS — M6281 Muscle weakness (generalized): Secondary | ICD-10-CM | POA: Diagnosis not present

## 2016-02-01 DIAGNOSIS — M25561 Pain in right knee: Secondary | ICD-10-CM | POA: Diagnosis not present

## 2016-02-01 DIAGNOSIS — M545 Low back pain: Secondary | ICD-10-CM | POA: Diagnosis not present

## 2016-02-02 DIAGNOSIS — M545 Low back pain: Secondary | ICD-10-CM | POA: Diagnosis not present

## 2016-02-02 DIAGNOSIS — M25562 Pain in left knee: Secondary | ICD-10-CM | POA: Diagnosis not present

## 2016-02-02 DIAGNOSIS — M25561 Pain in right knee: Secondary | ICD-10-CM | POA: Diagnosis not present

## 2016-02-02 DIAGNOSIS — R278 Other lack of coordination: Secondary | ICD-10-CM | POA: Diagnosis not present

## 2016-02-02 DIAGNOSIS — M6281 Muscle weakness (generalized): Secondary | ICD-10-CM | POA: Diagnosis not present

## 2016-02-03 DIAGNOSIS — Z23 Encounter for immunization: Secondary | ICD-10-CM | POA: Diagnosis not present

## 2016-02-07 DIAGNOSIS — M6281 Muscle weakness (generalized): Secondary | ICD-10-CM | POA: Diagnosis not present

## 2016-02-07 DIAGNOSIS — R278 Other lack of coordination: Secondary | ICD-10-CM | POA: Diagnosis not present

## 2016-02-07 DIAGNOSIS — M25561 Pain in right knee: Secondary | ICD-10-CM | POA: Diagnosis not present

## 2016-02-07 DIAGNOSIS — M25562 Pain in left knee: Secondary | ICD-10-CM | POA: Diagnosis not present

## 2016-02-07 DIAGNOSIS — M545 Low back pain: Secondary | ICD-10-CM | POA: Diagnosis not present

## 2016-02-08 DIAGNOSIS — M6281 Muscle weakness (generalized): Secondary | ICD-10-CM | POA: Diagnosis not present

## 2016-02-08 DIAGNOSIS — M545 Low back pain: Secondary | ICD-10-CM | POA: Diagnosis not present

## 2016-02-08 DIAGNOSIS — M25562 Pain in left knee: Secondary | ICD-10-CM | POA: Diagnosis not present

## 2016-02-08 DIAGNOSIS — R278 Other lack of coordination: Secondary | ICD-10-CM | POA: Diagnosis not present

## 2016-02-08 DIAGNOSIS — M25561 Pain in right knee: Secondary | ICD-10-CM | POA: Diagnosis not present

## 2016-02-09 DIAGNOSIS — R278 Other lack of coordination: Secondary | ICD-10-CM | POA: Diagnosis not present

## 2016-02-09 DIAGNOSIS — M6281 Muscle weakness (generalized): Secondary | ICD-10-CM | POA: Diagnosis not present

## 2016-02-09 DIAGNOSIS — M545 Low back pain: Secondary | ICD-10-CM | POA: Diagnosis not present

## 2016-02-09 DIAGNOSIS — K5900 Constipation, unspecified: Secondary | ICD-10-CM | POA: Diagnosis not present

## 2016-02-09 DIAGNOSIS — M25562 Pain in left knee: Secondary | ICD-10-CM | POA: Diagnosis not present

## 2016-02-09 DIAGNOSIS — M25561 Pain in right knee: Secondary | ICD-10-CM | POA: Diagnosis not present

## 2016-02-14 DIAGNOSIS — M6281 Muscle weakness (generalized): Secondary | ICD-10-CM | POA: Diagnosis not present

## 2016-02-14 DIAGNOSIS — M545 Low back pain: Secondary | ICD-10-CM | POA: Diagnosis not present

## 2016-02-14 DIAGNOSIS — R278 Other lack of coordination: Secondary | ICD-10-CM | POA: Diagnosis not present

## 2016-02-14 DIAGNOSIS — M25562 Pain in left knee: Secondary | ICD-10-CM | POA: Diagnosis not present

## 2016-02-14 DIAGNOSIS — M25561 Pain in right knee: Secondary | ICD-10-CM | POA: Diagnosis not present

## 2016-02-15 DIAGNOSIS — M6281 Muscle weakness (generalized): Secondary | ICD-10-CM | POA: Diagnosis not present

## 2016-02-15 DIAGNOSIS — M25561 Pain in right knee: Secondary | ICD-10-CM | POA: Diagnosis not present

## 2016-02-15 DIAGNOSIS — R278 Other lack of coordination: Secondary | ICD-10-CM | POA: Diagnosis not present

## 2016-02-15 DIAGNOSIS — M25562 Pain in left knee: Secondary | ICD-10-CM | POA: Diagnosis not present

## 2016-02-16 DIAGNOSIS — M25562 Pain in left knee: Secondary | ICD-10-CM | POA: Diagnosis not present

## 2016-02-16 DIAGNOSIS — R42 Dizziness and giddiness: Secondary | ICD-10-CM | POA: Diagnosis not present

## 2016-02-16 DIAGNOSIS — M25561 Pain in right knee: Secondary | ICD-10-CM | POA: Diagnosis not present

## 2016-02-16 DIAGNOSIS — I10 Essential (primary) hypertension: Secondary | ICD-10-CM | POA: Diagnosis not present

## 2016-02-16 DIAGNOSIS — M6281 Muscle weakness (generalized): Secondary | ICD-10-CM | POA: Diagnosis not present

## 2016-02-16 DIAGNOSIS — R278 Other lack of coordination: Secondary | ICD-10-CM | POA: Diagnosis not present

## 2016-02-21 DIAGNOSIS — R278 Other lack of coordination: Secondary | ICD-10-CM | POA: Diagnosis not present

## 2016-02-21 DIAGNOSIS — M25561 Pain in right knee: Secondary | ICD-10-CM | POA: Diagnosis not present

## 2016-02-21 DIAGNOSIS — M25562 Pain in left knee: Secondary | ICD-10-CM | POA: Diagnosis not present

## 2016-02-21 DIAGNOSIS — M6281 Muscle weakness (generalized): Secondary | ICD-10-CM | POA: Diagnosis not present

## 2016-02-22 DIAGNOSIS — M6281 Muscle weakness (generalized): Secondary | ICD-10-CM | POA: Diagnosis not present

## 2016-02-22 DIAGNOSIS — R278 Other lack of coordination: Secondary | ICD-10-CM | POA: Diagnosis not present

## 2016-02-22 DIAGNOSIS — M25562 Pain in left knee: Secondary | ICD-10-CM | POA: Diagnosis not present

## 2016-02-22 DIAGNOSIS — M25561 Pain in right knee: Secondary | ICD-10-CM | POA: Diagnosis not present

## 2016-02-23 DIAGNOSIS — M6281 Muscle weakness (generalized): Secondary | ICD-10-CM | POA: Diagnosis not present

## 2016-02-23 DIAGNOSIS — I10 Essential (primary) hypertension: Secondary | ICD-10-CM | POA: Diagnosis not present

## 2016-02-23 DIAGNOSIS — M25561 Pain in right knee: Secondary | ICD-10-CM | POA: Diagnosis not present

## 2016-02-23 DIAGNOSIS — M25562 Pain in left knee: Secondary | ICD-10-CM | POA: Diagnosis not present

## 2016-02-23 DIAGNOSIS — G501 Atypical facial pain: Secondary | ICD-10-CM | POA: Diagnosis not present

## 2016-02-23 DIAGNOSIS — R278 Other lack of coordination: Secondary | ICD-10-CM | POA: Diagnosis not present

## 2016-02-28 DIAGNOSIS — M25562 Pain in left knee: Secondary | ICD-10-CM | POA: Diagnosis not present

## 2016-02-28 DIAGNOSIS — R278 Other lack of coordination: Secondary | ICD-10-CM | POA: Diagnosis not present

## 2016-02-28 DIAGNOSIS — M6281 Muscle weakness (generalized): Secondary | ICD-10-CM | POA: Diagnosis not present

## 2016-02-28 DIAGNOSIS — M25561 Pain in right knee: Secondary | ICD-10-CM | POA: Diagnosis not present

## 2016-02-29 DIAGNOSIS — M25561 Pain in right knee: Secondary | ICD-10-CM | POA: Diagnosis not present

## 2016-02-29 DIAGNOSIS — R278 Other lack of coordination: Secondary | ICD-10-CM | POA: Diagnosis not present

## 2016-02-29 DIAGNOSIS — M25562 Pain in left knee: Secondary | ICD-10-CM | POA: Diagnosis not present

## 2016-02-29 DIAGNOSIS — M6281 Muscle weakness (generalized): Secondary | ICD-10-CM | POA: Diagnosis not present

## 2016-03-01 DIAGNOSIS — M25562 Pain in left knee: Secondary | ICD-10-CM | POA: Diagnosis not present

## 2016-03-01 DIAGNOSIS — M25561 Pain in right knee: Secondary | ICD-10-CM | POA: Diagnosis not present

## 2016-03-01 DIAGNOSIS — R278 Other lack of coordination: Secondary | ICD-10-CM | POA: Diagnosis not present

## 2016-03-01 DIAGNOSIS — R42 Dizziness and giddiness: Secondary | ICD-10-CM | POA: Diagnosis not present

## 2016-03-01 DIAGNOSIS — M6281 Muscle weakness (generalized): Secondary | ICD-10-CM | POA: Diagnosis not present

## 2016-03-06 DIAGNOSIS — M25562 Pain in left knee: Secondary | ICD-10-CM | POA: Diagnosis not present

## 2016-03-06 DIAGNOSIS — R278 Other lack of coordination: Secondary | ICD-10-CM | POA: Diagnosis not present

## 2016-03-06 DIAGNOSIS — M6281 Muscle weakness (generalized): Secondary | ICD-10-CM | POA: Diagnosis not present

## 2016-03-06 DIAGNOSIS — M25561 Pain in right knee: Secondary | ICD-10-CM | POA: Diagnosis not present

## 2016-03-07 DIAGNOSIS — R278 Other lack of coordination: Secondary | ICD-10-CM | POA: Diagnosis not present

## 2016-03-07 DIAGNOSIS — M6281 Muscle weakness (generalized): Secondary | ICD-10-CM | POA: Diagnosis not present

## 2016-03-07 DIAGNOSIS — M25562 Pain in left knee: Secondary | ICD-10-CM | POA: Diagnosis not present

## 2016-03-07 DIAGNOSIS — M25561 Pain in right knee: Secondary | ICD-10-CM | POA: Diagnosis not present

## 2016-03-12 DIAGNOSIS — M25561 Pain in right knee: Secondary | ICD-10-CM | POA: Diagnosis not present

## 2016-03-12 DIAGNOSIS — M25562 Pain in left knee: Secondary | ICD-10-CM | POA: Diagnosis not present

## 2016-03-12 DIAGNOSIS — R278 Other lack of coordination: Secondary | ICD-10-CM | POA: Diagnosis not present

## 2016-03-12 DIAGNOSIS — M6281 Muscle weakness (generalized): Secondary | ICD-10-CM | POA: Diagnosis not present

## 2016-03-13 DIAGNOSIS — M25562 Pain in left knee: Secondary | ICD-10-CM | POA: Diagnosis not present

## 2016-03-13 DIAGNOSIS — M6281 Muscle weakness (generalized): Secondary | ICD-10-CM | POA: Diagnosis not present

## 2016-03-13 DIAGNOSIS — M25561 Pain in right knee: Secondary | ICD-10-CM | POA: Diagnosis not present

## 2016-03-13 DIAGNOSIS — R278 Other lack of coordination: Secondary | ICD-10-CM | POA: Diagnosis not present

## 2016-03-14 DIAGNOSIS — R278 Other lack of coordination: Secondary | ICD-10-CM | POA: Diagnosis not present

## 2016-03-14 DIAGNOSIS — M25562 Pain in left knee: Secondary | ICD-10-CM | POA: Diagnosis not present

## 2016-03-14 DIAGNOSIS — M25561 Pain in right knee: Secondary | ICD-10-CM | POA: Diagnosis not present

## 2016-03-14 DIAGNOSIS — M6281 Muscle weakness (generalized): Secondary | ICD-10-CM | POA: Diagnosis not present

## 2016-03-15 DIAGNOSIS — D329 Benign neoplasm of meninges, unspecified: Secondary | ICD-10-CM | POA: Diagnosis not present

## 2016-03-15 DIAGNOSIS — I10 Essential (primary) hypertension: Secondary | ICD-10-CM | POA: Diagnosis not present

## 2016-03-15 DIAGNOSIS — R55 Syncope and collapse: Secondary | ICD-10-CM | POA: Diagnosis not present

## 2016-03-15 DIAGNOSIS — M6281 Muscle weakness (generalized): Secondary | ICD-10-CM | POA: Diagnosis not present

## 2016-03-15 DIAGNOSIS — M25562 Pain in left knee: Secondary | ICD-10-CM | POA: Diagnosis not present

## 2016-03-15 DIAGNOSIS — I700 Atherosclerosis of aorta: Secondary | ICD-10-CM | POA: Diagnosis not present

## 2016-03-15 DIAGNOSIS — I6523 Occlusion and stenosis of bilateral carotid arteries: Secondary | ICD-10-CM | POA: Diagnosis not present

## 2016-03-15 DIAGNOSIS — R278 Other lack of coordination: Secondary | ICD-10-CM | POA: Diagnosis not present

## 2016-03-15 DIAGNOSIS — D164 Benign neoplasm of bones of skull and face: Secondary | ICD-10-CM | POA: Diagnosis not present

## 2016-03-15 DIAGNOSIS — M25561 Pain in right knee: Secondary | ICD-10-CM | POA: Diagnosis not present

## 2016-03-15 DIAGNOSIS — D320 Benign neoplasm of cerebral meninges: Secondary | ICD-10-CM | POA: Diagnosis not present

## 2016-03-20 DIAGNOSIS — M6281 Muscle weakness (generalized): Secondary | ICD-10-CM | POA: Diagnosis not present

## 2016-03-20 DIAGNOSIS — M25562 Pain in left knee: Secondary | ICD-10-CM | POA: Diagnosis not present

## 2016-03-20 DIAGNOSIS — R278 Other lack of coordination: Secondary | ICD-10-CM | POA: Diagnosis not present

## 2016-03-20 DIAGNOSIS — M25561 Pain in right knee: Secondary | ICD-10-CM | POA: Diagnosis not present

## 2016-03-20 DIAGNOSIS — M545 Low back pain: Secondary | ICD-10-CM | POA: Diagnosis not present

## 2016-03-21 DIAGNOSIS — R278 Other lack of coordination: Secondary | ICD-10-CM | POA: Diagnosis not present

## 2016-03-21 DIAGNOSIS — M25562 Pain in left knee: Secondary | ICD-10-CM | POA: Diagnosis not present

## 2016-03-21 DIAGNOSIS — M545 Low back pain: Secondary | ICD-10-CM | POA: Diagnosis not present

## 2016-03-21 DIAGNOSIS — M6281 Muscle weakness (generalized): Secondary | ICD-10-CM | POA: Diagnosis not present

## 2016-03-21 DIAGNOSIS — M25561 Pain in right knee: Secondary | ICD-10-CM | POA: Diagnosis not present

## 2016-03-22 DIAGNOSIS — M6281 Muscle weakness (generalized): Secondary | ICD-10-CM | POA: Diagnosis not present

## 2016-03-22 DIAGNOSIS — M25562 Pain in left knee: Secondary | ICD-10-CM | POA: Diagnosis not present

## 2016-03-22 DIAGNOSIS — M545 Low back pain: Secondary | ICD-10-CM | POA: Diagnosis not present

## 2016-03-22 DIAGNOSIS — R278 Other lack of coordination: Secondary | ICD-10-CM | POA: Diagnosis not present

## 2016-03-22 DIAGNOSIS — M25561 Pain in right knee: Secondary | ICD-10-CM | POA: Diagnosis not present

## 2016-03-27 DIAGNOSIS — M25562 Pain in left knee: Secondary | ICD-10-CM | POA: Diagnosis not present

## 2016-03-27 DIAGNOSIS — M545 Low back pain: Secondary | ICD-10-CM | POA: Diagnosis not present

## 2016-03-27 DIAGNOSIS — R278 Other lack of coordination: Secondary | ICD-10-CM | POA: Diagnosis not present

## 2016-03-27 DIAGNOSIS — M25561 Pain in right knee: Secondary | ICD-10-CM | POA: Diagnosis not present

## 2016-03-27 DIAGNOSIS — M6281 Muscle weakness (generalized): Secondary | ICD-10-CM | POA: Diagnosis not present

## 2016-03-28 DIAGNOSIS — M545 Low back pain: Secondary | ICD-10-CM | POA: Diagnosis not present

## 2016-03-28 DIAGNOSIS — M25562 Pain in left knee: Secondary | ICD-10-CM | POA: Diagnosis not present

## 2016-03-28 DIAGNOSIS — M6281 Muscle weakness (generalized): Secondary | ICD-10-CM | POA: Diagnosis not present

## 2016-03-28 DIAGNOSIS — R278 Other lack of coordination: Secondary | ICD-10-CM | POA: Diagnosis not present

## 2016-03-28 DIAGNOSIS — M25561 Pain in right knee: Secondary | ICD-10-CM | POA: Diagnosis not present

## 2016-03-29 DIAGNOSIS — R278 Other lack of coordination: Secondary | ICD-10-CM | POA: Diagnosis not present

## 2016-03-29 DIAGNOSIS — M6281 Muscle weakness (generalized): Secondary | ICD-10-CM | POA: Diagnosis not present

## 2016-03-29 DIAGNOSIS — M545 Low back pain: Secondary | ICD-10-CM | POA: Diagnosis not present

## 2016-03-29 DIAGNOSIS — M25562 Pain in left knee: Secondary | ICD-10-CM | POA: Diagnosis not present

## 2016-03-29 DIAGNOSIS — M25561 Pain in right knee: Secondary | ICD-10-CM | POA: Diagnosis not present

## 2016-04-04 DIAGNOSIS — M25561 Pain in right knee: Secondary | ICD-10-CM | POA: Diagnosis not present

## 2016-04-04 DIAGNOSIS — M25562 Pain in left knee: Secondary | ICD-10-CM | POA: Diagnosis not present

## 2016-04-04 DIAGNOSIS — M545 Low back pain: Secondary | ICD-10-CM | POA: Diagnosis not present

## 2016-04-04 DIAGNOSIS — M6281 Muscle weakness (generalized): Secondary | ICD-10-CM | POA: Diagnosis not present

## 2016-04-04 DIAGNOSIS — R278 Other lack of coordination: Secondary | ICD-10-CM | POA: Diagnosis not present

## 2016-04-05 DIAGNOSIS — M6281 Muscle weakness (generalized): Secondary | ICD-10-CM | POA: Diagnosis not present

## 2016-04-05 DIAGNOSIS — M25562 Pain in left knee: Secondary | ICD-10-CM | POA: Diagnosis not present

## 2016-04-05 DIAGNOSIS — R5383 Other fatigue: Secondary | ICD-10-CM | POA: Diagnosis not present

## 2016-04-05 DIAGNOSIS — M25561 Pain in right knee: Secondary | ICD-10-CM | POA: Diagnosis not present

## 2016-04-05 DIAGNOSIS — N390 Urinary tract infection, site not specified: Secondary | ICD-10-CM | POA: Diagnosis not present

## 2016-04-05 DIAGNOSIS — R278 Other lack of coordination: Secondary | ICD-10-CM | POA: Diagnosis not present

## 2016-04-05 DIAGNOSIS — M545 Low back pain: Secondary | ICD-10-CM | POA: Diagnosis not present

## 2016-04-10 DIAGNOSIS — M25562 Pain in left knee: Secondary | ICD-10-CM | POA: Diagnosis not present

## 2016-04-10 DIAGNOSIS — R278 Other lack of coordination: Secondary | ICD-10-CM | POA: Diagnosis not present

## 2016-04-10 DIAGNOSIS — M25561 Pain in right knee: Secondary | ICD-10-CM | POA: Diagnosis not present

## 2016-04-10 DIAGNOSIS — M6281 Muscle weakness (generalized): Secondary | ICD-10-CM | POA: Diagnosis not present

## 2016-04-10 DIAGNOSIS — M545 Low back pain: Secondary | ICD-10-CM | POA: Diagnosis not present

## 2016-04-11 DIAGNOSIS — M25562 Pain in left knee: Secondary | ICD-10-CM | POA: Diagnosis not present

## 2016-04-11 DIAGNOSIS — M6281 Muscle weakness (generalized): Secondary | ICD-10-CM | POA: Diagnosis not present

## 2016-04-11 DIAGNOSIS — R278 Other lack of coordination: Secondary | ICD-10-CM | POA: Diagnosis not present

## 2016-04-11 DIAGNOSIS — M25561 Pain in right knee: Secondary | ICD-10-CM | POA: Diagnosis not present

## 2016-04-11 DIAGNOSIS — M545 Low back pain: Secondary | ICD-10-CM | POA: Diagnosis not present

## 2016-04-17 DIAGNOSIS — M25562 Pain in left knee: Secondary | ICD-10-CM | POA: Diagnosis not present

## 2016-04-17 DIAGNOSIS — R278 Other lack of coordination: Secondary | ICD-10-CM | POA: Diagnosis not present

## 2016-04-17 DIAGNOSIS — M545 Low back pain: Secondary | ICD-10-CM | POA: Diagnosis not present

## 2016-04-17 DIAGNOSIS — M6281 Muscle weakness (generalized): Secondary | ICD-10-CM | POA: Diagnosis not present

## 2016-04-17 DIAGNOSIS — M25561 Pain in right knee: Secondary | ICD-10-CM | POA: Diagnosis not present

## 2016-04-19 DIAGNOSIS — I2510 Atherosclerotic heart disease of native coronary artery without angina pectoris: Secondary | ICD-10-CM | POA: Diagnosis not present

## 2016-04-19 DIAGNOSIS — R55 Syncope and collapse: Secondary | ICD-10-CM | POA: Diagnosis not present

## 2016-04-19 DIAGNOSIS — I10 Essential (primary) hypertension: Secondary | ICD-10-CM | POA: Diagnosis not present

## 2016-04-26 DIAGNOSIS — M216X9 Other acquired deformities of unspecified foot: Secondary | ICD-10-CM | POA: Diagnosis not present

## 2016-04-26 DIAGNOSIS — M79671 Pain in right foot: Secondary | ICD-10-CM | POA: Diagnosis not present

## 2016-04-26 DIAGNOSIS — L84 Corns and callosities: Secondary | ICD-10-CM | POA: Diagnosis not present

## 2016-04-26 DIAGNOSIS — M79672 Pain in left foot: Secondary | ICD-10-CM | POA: Diagnosis not present

## 2016-05-31 DIAGNOSIS — H9190 Unspecified hearing loss, unspecified ear: Secondary | ICD-10-CM | POA: Diagnosis not present

## 2016-05-31 DIAGNOSIS — M25561 Pain in right knee: Secondary | ICD-10-CM | POA: Diagnosis not present

## 2016-06-01 DIAGNOSIS — M79671 Pain in right foot: Secondary | ICD-10-CM | POA: Diagnosis not present

## 2016-06-01 DIAGNOSIS — M25561 Pain in right knee: Secondary | ICD-10-CM | POA: Diagnosis not present

## 2016-06-01 DIAGNOSIS — M7741 Metatarsalgia, right foot: Secondary | ICD-10-CM | POA: Diagnosis not present

## 2016-06-02 DIAGNOSIS — H9190 Unspecified hearing loss, unspecified ear: Secondary | ICD-10-CM | POA: Diagnosis not present

## 2016-06-02 DIAGNOSIS — R112 Nausea with vomiting, unspecified: Secondary | ICD-10-CM | POA: Diagnosis not present

## 2016-06-02 DIAGNOSIS — Z7982 Long term (current) use of aspirin: Secondary | ICD-10-CM | POA: Diagnosis not present

## 2016-06-02 DIAGNOSIS — R531 Weakness: Secondary | ICD-10-CM | POA: Diagnosis not present

## 2016-06-02 DIAGNOSIS — R404 Transient alteration of awareness: Secondary | ICD-10-CM | POA: Diagnosis not present

## 2016-06-06 DIAGNOSIS — R531 Weakness: Secondary | ICD-10-CM | POA: Diagnosis not present

## 2016-06-06 DIAGNOSIS — Z79899 Other long term (current) drug therapy: Secondary | ICD-10-CM | POA: Diagnosis not present

## 2016-06-06 DIAGNOSIS — R1031 Right lower quadrant pain: Secondary | ICD-10-CM | POA: Diagnosis not present

## 2016-06-06 DIAGNOSIS — R109 Unspecified abdominal pain: Secondary | ICD-10-CM | POA: Diagnosis not present

## 2016-06-06 DIAGNOSIS — M4850XA Collapsed vertebra, not elsewhere classified, site unspecified, initial encounter for fracture: Secondary | ICD-10-CM | POA: Diagnosis not present

## 2016-06-06 DIAGNOSIS — K5730 Diverticulosis of large intestine without perforation or abscess without bleeding: Secondary | ICD-10-CM | POA: Diagnosis not present

## 2016-06-06 DIAGNOSIS — G4489 Other headache syndrome: Secondary | ICD-10-CM | POA: Diagnosis not present

## 2016-06-06 DIAGNOSIS — I10 Essential (primary) hypertension: Secondary | ICD-10-CM | POA: Diagnosis not present

## 2016-06-06 DIAGNOSIS — I440 Atrioventricular block, first degree: Secondary | ICD-10-CM | POA: Diagnosis not present

## 2016-06-06 DIAGNOSIS — R404 Transient alteration of awareness: Secondary | ICD-10-CM | POA: Diagnosis not present

## 2016-06-06 DIAGNOSIS — N390 Urinary tract infection, site not specified: Secondary | ICD-10-CM | POA: Diagnosis not present

## 2016-06-06 DIAGNOSIS — Z7982 Long term (current) use of aspirin: Secondary | ICD-10-CM | POA: Diagnosis not present

## 2016-06-08 DIAGNOSIS — N390 Urinary tract infection, site not specified: Secondary | ICD-10-CM | POA: Diagnosis not present

## 2016-06-08 DIAGNOSIS — R1084 Generalized abdominal pain: Secondary | ICD-10-CM | POA: Diagnosis not present

## 2016-06-08 DIAGNOSIS — I10 Essential (primary) hypertension: Secondary | ICD-10-CM | POA: Diagnosis not present

## 2016-06-26 DIAGNOSIS — M85861 Other specified disorders of bone density and structure, right lower leg: Secondary | ICD-10-CM | POA: Diagnosis not present

## 2016-06-26 DIAGNOSIS — M1711 Unilateral primary osteoarthritis, right knee: Secondary | ICD-10-CM | POA: Diagnosis not present

## 2016-06-26 DIAGNOSIS — M21161 Varus deformity, not elsewhere classified, right knee: Secondary | ICD-10-CM | POA: Diagnosis not present

## 2016-06-26 DIAGNOSIS — M170 Bilateral primary osteoarthritis of knee: Secondary | ICD-10-CM | POA: Diagnosis not present

## 2016-06-26 DIAGNOSIS — M21162 Varus deformity, not elsewhere classified, left knee: Secondary | ICD-10-CM | POA: Diagnosis not present

## 2016-06-28 DIAGNOSIS — K5900 Constipation, unspecified: Secondary | ICD-10-CM | POA: Diagnosis not present

## 2016-06-28 DIAGNOSIS — R1084 Generalized abdominal pain: Secondary | ICD-10-CM | POA: Diagnosis not present

## 2016-07-04 DIAGNOSIS — I10 Essential (primary) hypertension: Secondary | ICD-10-CM | POA: Diagnosis not present

## 2016-07-04 DIAGNOSIS — D649 Anemia, unspecified: Secondary | ICD-10-CM | POA: Diagnosis not present

## 2016-07-04 DIAGNOSIS — M810 Age-related osteoporosis without current pathological fracture: Secondary | ICD-10-CM | POA: Diagnosis not present

## 2016-07-04 DIAGNOSIS — D511 Vitamin B12 deficiency anemia due to selective vitamin B12 malabsorption with proteinuria: Secondary | ICD-10-CM | POA: Diagnosis not present

## 2016-07-04 DIAGNOSIS — E559 Vitamin D deficiency, unspecified: Secondary | ICD-10-CM | POA: Diagnosis not present

## 2016-08-15 DIAGNOSIS — I739 Peripheral vascular disease, unspecified: Secondary | ICD-10-CM | POA: Diagnosis not present

## 2016-08-15 DIAGNOSIS — L603 Nail dystrophy: Secondary | ICD-10-CM | POA: Diagnosis not present

## 2016-08-15 DIAGNOSIS — M7741 Metatarsalgia, right foot: Secondary | ICD-10-CM | POA: Diagnosis not present

## 2016-08-30 DIAGNOSIS — H903 Sensorineural hearing loss, bilateral: Secondary | ICD-10-CM | POA: Diagnosis not present

## 2016-08-30 DIAGNOSIS — H6123 Impacted cerumen, bilateral: Secondary | ICD-10-CM | POA: Diagnosis not present

## 2016-08-30 DIAGNOSIS — H9201 Otalgia, right ear: Secondary | ICD-10-CM | POA: Diagnosis not present

## 2016-09-13 DIAGNOSIS — R197 Diarrhea, unspecified: Secondary | ICD-10-CM | POA: Diagnosis not present

## 2016-10-11 DIAGNOSIS — M25561 Pain in right knee: Secondary | ICD-10-CM | POA: Diagnosis not present

## 2016-10-11 DIAGNOSIS — M25562 Pain in left knee: Secondary | ICD-10-CM | POA: Diagnosis not present

## 2016-10-11 DIAGNOSIS — M1990 Unspecified osteoarthritis, unspecified site: Secondary | ICD-10-CM | POA: Diagnosis not present

## 2016-10-23 DIAGNOSIS — M170 Bilateral primary osteoarthritis of knee: Secondary | ICD-10-CM | POA: Diagnosis not present

## 2016-10-24 DIAGNOSIS — I10 Essential (primary) hypertension: Secondary | ICD-10-CM | POA: Diagnosis not present

## 2016-10-24 DIAGNOSIS — D649 Anemia, unspecified: Secondary | ICD-10-CM | POA: Diagnosis not present

## 2016-10-24 DIAGNOSIS — M810 Age-related osteoporosis without current pathological fracture: Secondary | ICD-10-CM | POA: Diagnosis not present

## 2016-10-24 DIAGNOSIS — D511 Vitamin B12 deficiency anemia due to selective vitamin B12 malabsorption with proteinuria: Secondary | ICD-10-CM | POA: Diagnosis not present

## 2016-10-25 DIAGNOSIS — M25561 Pain in right knee: Secondary | ICD-10-CM | POA: Diagnosis not present

## 2016-10-25 DIAGNOSIS — M25562 Pain in left knee: Secondary | ICD-10-CM | POA: Diagnosis not present

## 2016-10-28 IMAGING — CR DG CHEST 2V
2 series · 2 of 2 positions shown · non-contrast
Comparison: None.

CLINICAL DATA: Cough for 1 week, wheezing, shortness breath.

EXAM:
CHEST  2 VIEW

[chest pa]
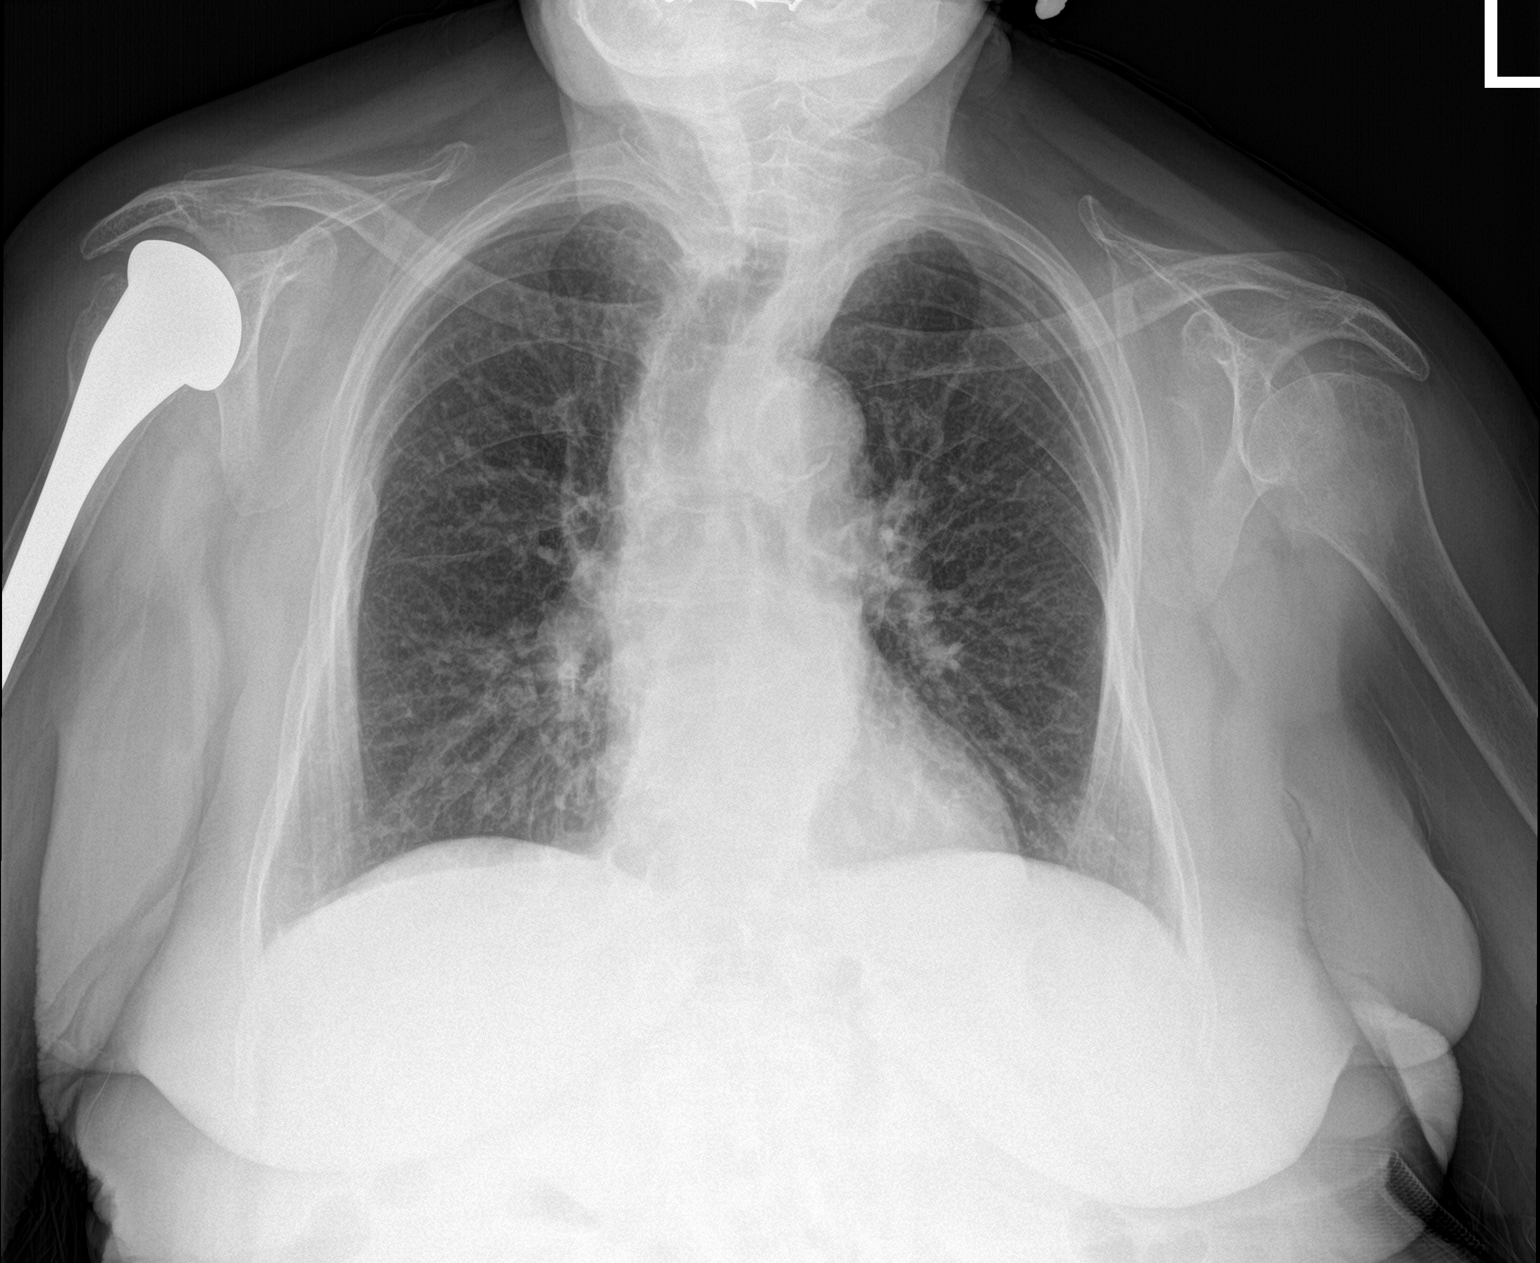

[chest lat]
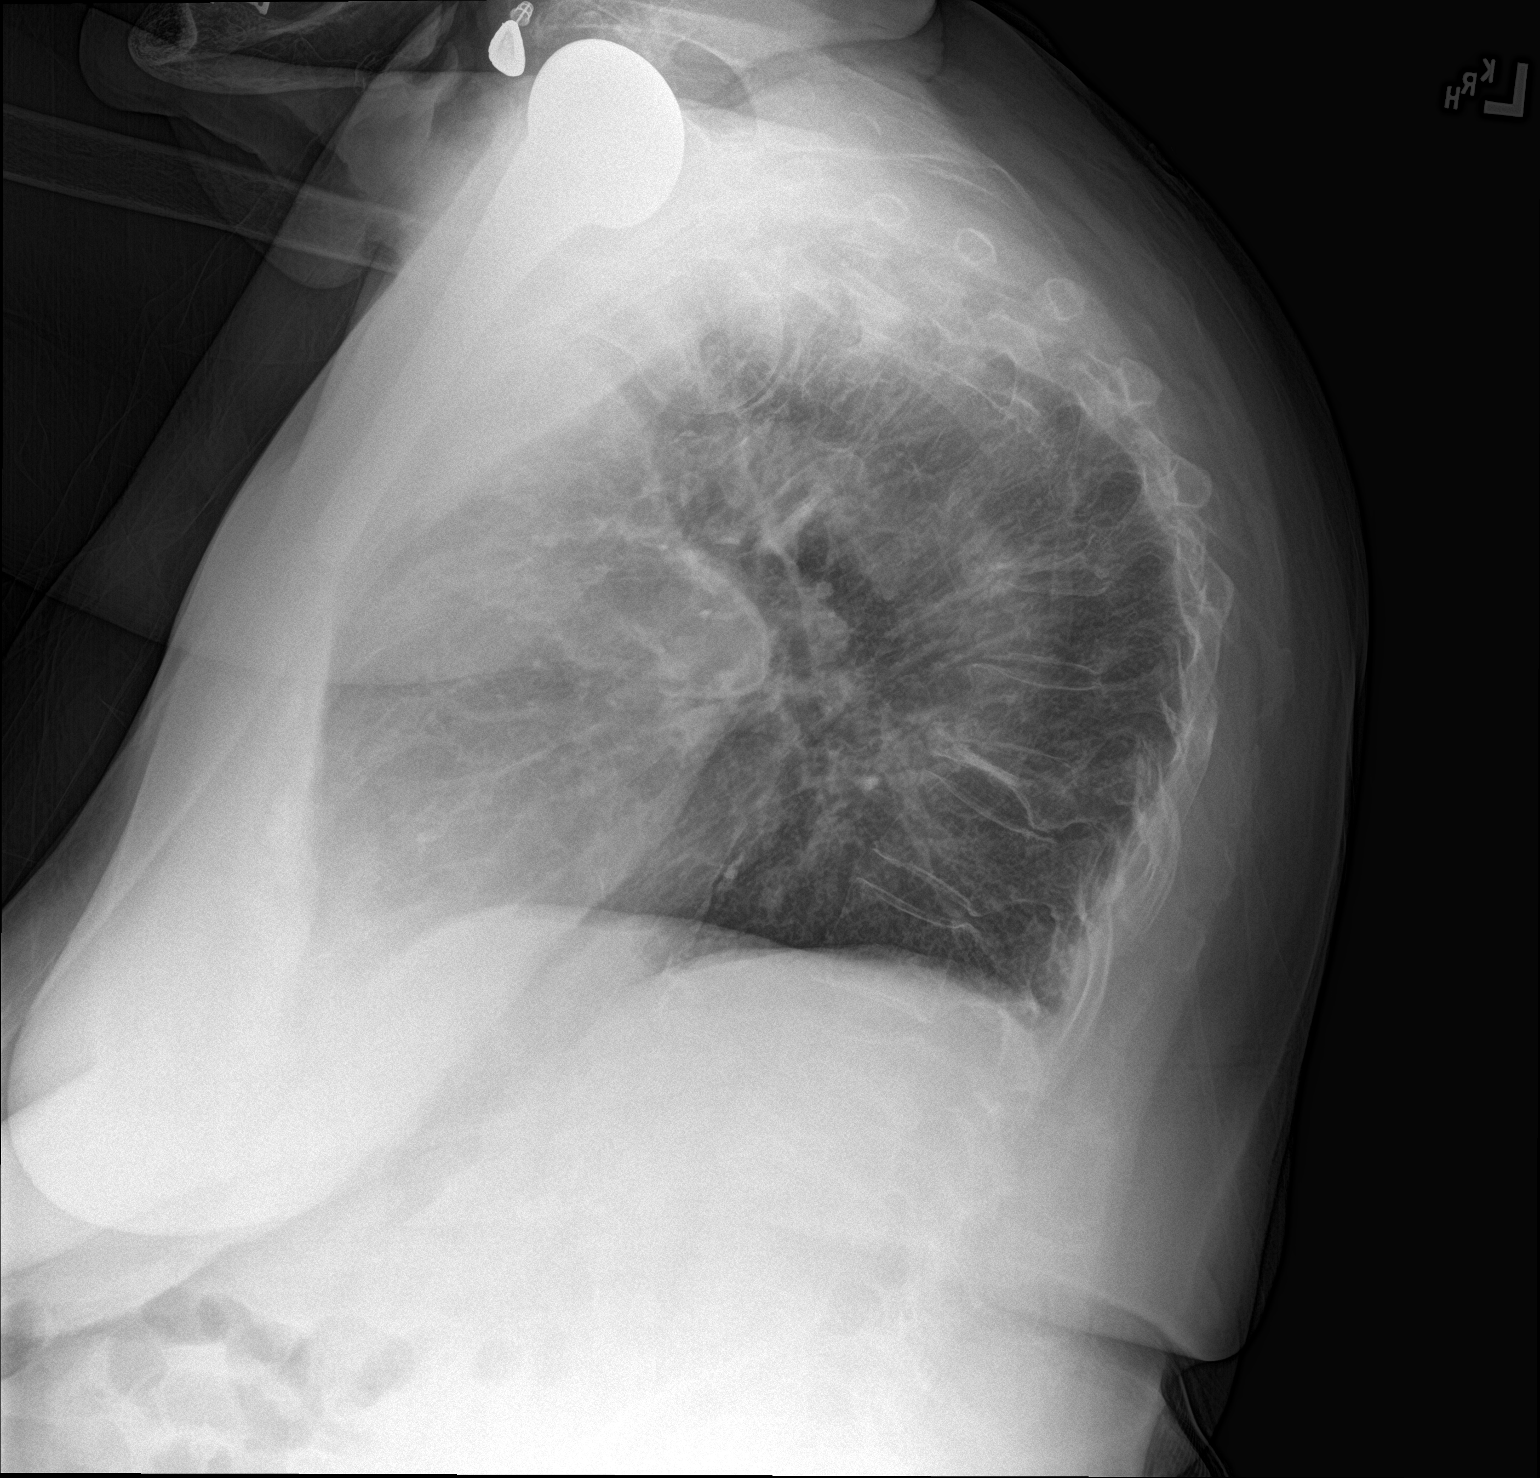

[2 of 2 positions shown; findings below may reference images not displayed]

FINDINGS: Peribronchial thickening and interstitial prominence throughout the
lungs. Heart is normal size. No confluent opacities or effusions.

Complete collapse of 2 mid to lower thoracic vertebral bodies with
vertebral plana.
IMPRESSION: Peribronchial thickening and interstitial prominence likely reflect
bronchitic changes.

Vertebra plana within the mid to lower thoracic spine at 2 levels.

## 2016-11-22 DIAGNOSIS — N390 Urinary tract infection, site not specified: Secondary | ICD-10-CM | POA: Diagnosis not present

## 2016-11-22 DIAGNOSIS — H109 Unspecified conjunctivitis: Secondary | ICD-10-CM | POA: Diagnosis not present

## 2016-11-30 DIAGNOSIS — Z961 Presence of intraocular lens: Secondary | ICD-10-CM | POA: Diagnosis not present

## 2016-11-30 DIAGNOSIS — Z9849 Cataract extraction status, unspecified eye: Secondary | ICD-10-CM | POA: Diagnosis not present

## 2016-12-10 DIAGNOSIS — Z1389 Encounter for screening for other disorder: Secondary | ICD-10-CM | POA: Diagnosis not present

## 2016-12-10 DIAGNOSIS — M25569 Pain in unspecified knee: Secondary | ICD-10-CM | POA: Diagnosis not present

## 2016-12-10 DIAGNOSIS — M1710 Unilateral primary osteoarthritis, unspecified knee: Secondary | ICD-10-CM | POA: Diagnosis not present

## 2016-12-13 DIAGNOSIS — J019 Acute sinusitis, unspecified: Secondary | ICD-10-CM | POA: Diagnosis not present

## 2016-12-13 DIAGNOSIS — J209 Acute bronchitis, unspecified: Secondary | ICD-10-CM | POA: Diagnosis not present

## 2016-12-24 DIAGNOSIS — Z23 Encounter for immunization: Secondary | ICD-10-CM | POA: Diagnosis not present

## 2017-01-03 DIAGNOSIS — M1710 Unilateral primary osteoarthritis, unspecified knee: Secondary | ICD-10-CM | POA: Diagnosis not present

## 2017-01-03 DIAGNOSIS — M25569 Pain in unspecified knee: Secondary | ICD-10-CM | POA: Diagnosis not present

## 2017-02-27 DIAGNOSIS — M79671 Pain in right foot: Secondary | ICD-10-CM | POA: Diagnosis not present

## 2017-02-27 DIAGNOSIS — M1990 Unspecified osteoarthritis, unspecified site: Secondary | ICD-10-CM | POA: Diagnosis not present

## 2017-02-27 DIAGNOSIS — I10 Essential (primary) hypertension: Secondary | ICD-10-CM | POA: Diagnosis not present

## 2017-03-06 DIAGNOSIS — M810 Age-related osteoporosis without current pathological fracture: Secondary | ICD-10-CM | POA: Diagnosis not present

## 2017-03-06 DIAGNOSIS — D649 Anemia, unspecified: Secondary | ICD-10-CM | POA: Diagnosis not present

## 2017-03-06 DIAGNOSIS — I10 Essential (primary) hypertension: Secondary | ICD-10-CM | POA: Diagnosis not present

## 2017-03-06 DIAGNOSIS — D511 Vitamin B12 deficiency anemia due to selective vitamin B12 malabsorption with proteinuria: Secondary | ICD-10-CM | POA: Diagnosis not present

## 2017-03-14 DIAGNOSIS — R309 Painful micturition, unspecified: Secondary | ICD-10-CM | POA: Diagnosis not present

## 2017-03-21 DIAGNOSIS — M79671 Pain in right foot: Secondary | ICD-10-CM | POA: Diagnosis not present

## 2017-03-28 DIAGNOSIS — L603 Nail dystrophy: Secondary | ICD-10-CM | POA: Diagnosis not present

## 2017-03-28 DIAGNOSIS — I739 Peripheral vascular disease, unspecified: Secondary | ICD-10-CM | POA: Diagnosis not present

## 2017-03-28 DIAGNOSIS — M7741 Metatarsalgia, right foot: Secondary | ICD-10-CM | POA: Diagnosis not present

## 2017-03-29 DIAGNOSIS — M1710 Unilateral primary osteoarthritis, unspecified knee: Secondary | ICD-10-CM | POA: Diagnosis not present

## 2017-03-29 DIAGNOSIS — M25569 Pain in unspecified knee: Secondary | ICD-10-CM | POA: Diagnosis not present
